# Patient Record
Sex: Female | Born: 2000
Health system: Southern US, Community
[De-identification: ages and names within clinical notes are randomized; demographics above are authoritative.]

## PROBLEM LIST (undated history)

## (undated) DIAGNOSIS — N926 Irregular menstruation, unspecified: Secondary | ICD-10-CM

## (undated) DIAGNOSIS — Z87442 Personal history of urinary calculi: Secondary | ICD-10-CM

## (undated) DIAGNOSIS — K7689 Other specified diseases of liver: Secondary | ICD-10-CM

## (undated) DIAGNOSIS — J3501 Chronic tonsillitis: Secondary | ICD-10-CM

## (undated) DIAGNOSIS — L709 Acne, unspecified: Secondary | ICD-10-CM

## (undated) DIAGNOSIS — K219 Gastro-esophageal reflux disease without esophagitis: Secondary | ICD-10-CM

## (undated) DIAGNOSIS — F419 Anxiety disorder, unspecified: Secondary | ICD-10-CM

## (undated) DIAGNOSIS — F32A Depression, unspecified: Secondary | ICD-10-CM

## (undated) DIAGNOSIS — F988 Other specified behavioral and emotional disorders with onset usually occurring in childhood and adolescence: Secondary | ICD-10-CM

## (undated) HISTORY — DX: Depression, unspecified: F32.A

## (undated) HISTORY — PX: TYMPANOSTOMY TUBE PLACEMENT: SHX32

## (undated) HISTORY — DX: Gastro-esophageal reflux disease without esophagitis: K21.9

## (undated) HISTORY — DX: Other specified diseases of liver: K76.89

## (undated) HISTORY — DX: Anxiety disorder, unspecified: F41.9

---
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
## 2021-01-05 ENCOUNTER — Other Ambulatory Visit (INDEPENDENT_AMBULATORY_CARE_PROVIDER_SITE_OTHER): Payer: Self-pay | Admitting: Pediatric Endocrinology

## 2021-01-05 ENCOUNTER — Encounter (INDEPENDENT_AMBULATORY_CARE_PROVIDER_SITE_OTHER): Payer: Self-pay

## 2021-01-05 DIAGNOSIS — K760 Fatty (change of) liver, not elsewhere classified: Secondary | ICD-10-CM | POA: Diagnosis not present

## 2021-01-05 DIAGNOSIS — Z68.41 Body mass index (BMI) pediatric, greater than or equal to 95th percentile for age: Secondary | ICD-10-CM

## 2021-01-05 NOTE — Telephone Encounter (Signed)
Called patient on 01/05/2021 at 3:57 PM and left HIPAA-compliant VM with instructions to call [facility] back.  Plan to discuss GLP-1 appeal denial.   Thank you for involving pharmacy/diabetes educator to assist in providing this patient's care.   Drexel Iha, PharmD, CPP, CDCES

## 2021-01-16 ENCOUNTER — Other Ambulatory Visit (HOSPITAL_COMMUNITY): Payer: Self-pay | Admitting: Family Medicine

## 2021-01-22 IMAGING — US US ABDOMEN COMPLETE
1 series · 13 of 25 positions shown · non-contrast
Comparison: 01/10/2020, 11/04/2015

CLINICAL DATA: Follow-up liver cysts

EXAM:
ABDOMEN ULTRASOUND COMPLETE

[Series 1: us abdomen complete · 0.23mm/px · 13 of 125 slices shown]
[im 1/125]
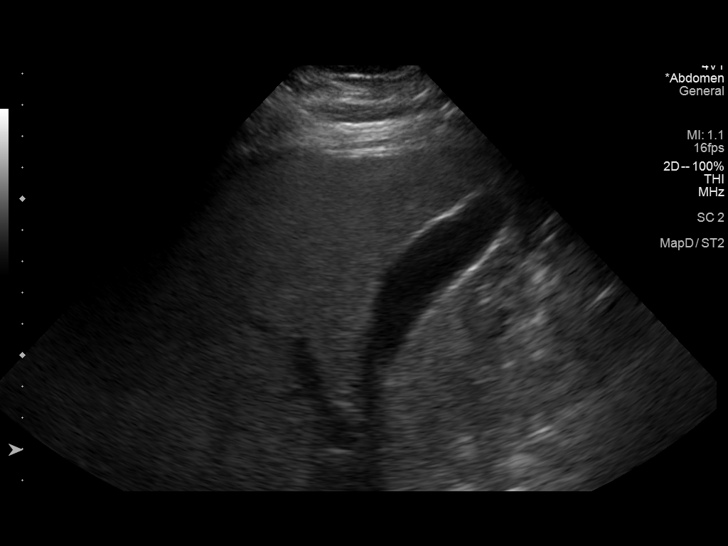
[im 11/125]
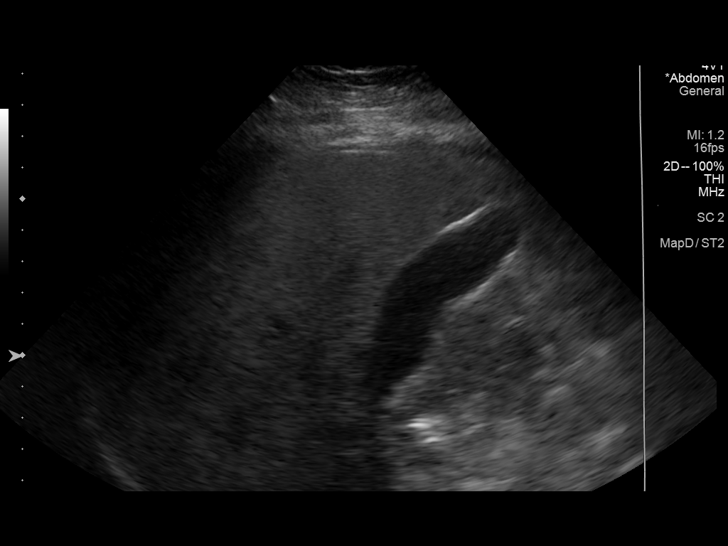
[im 21/125]
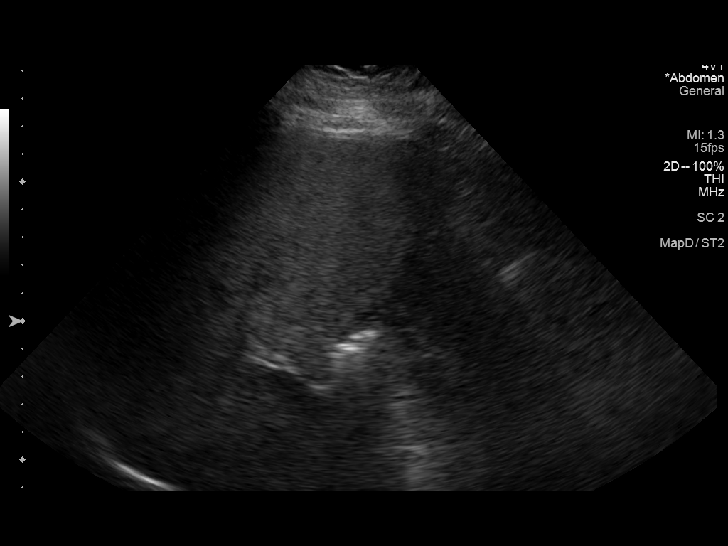
[im 32/125]
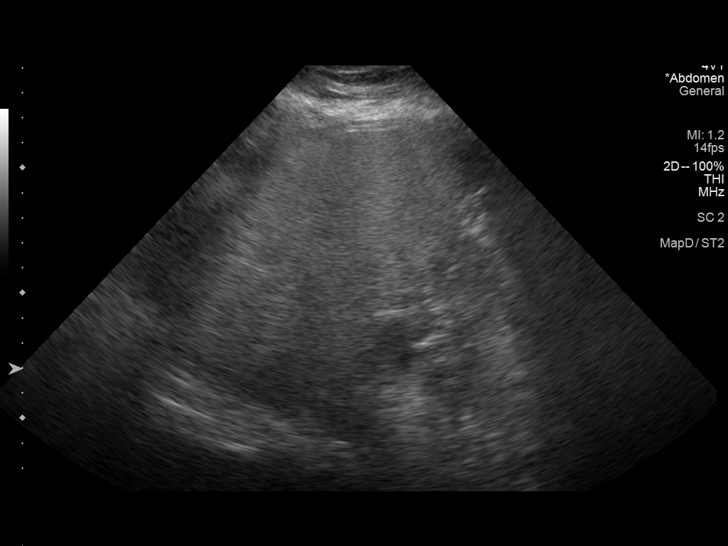
[im 42/125]
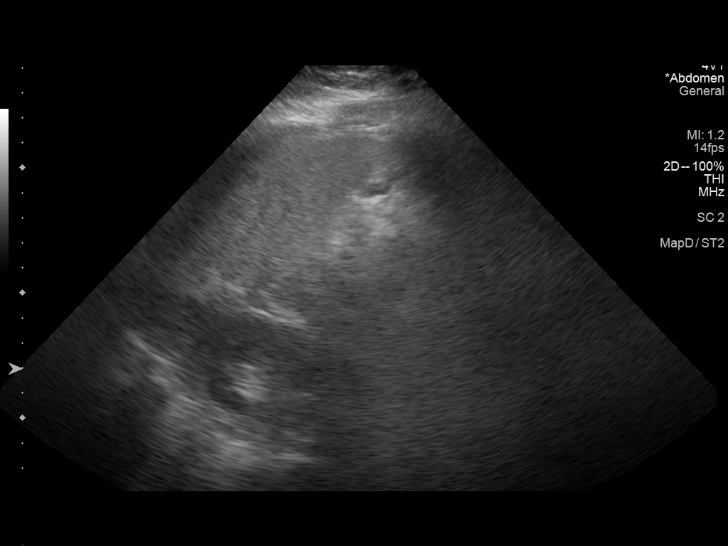
[im 52/125]
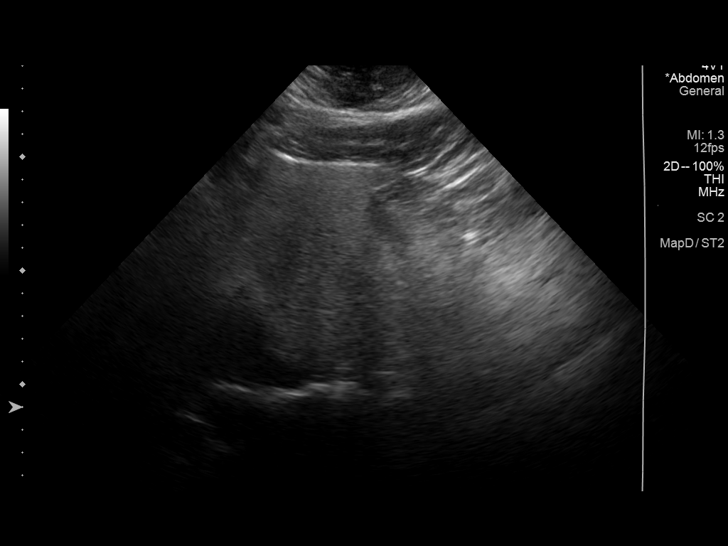
[im 63/125]
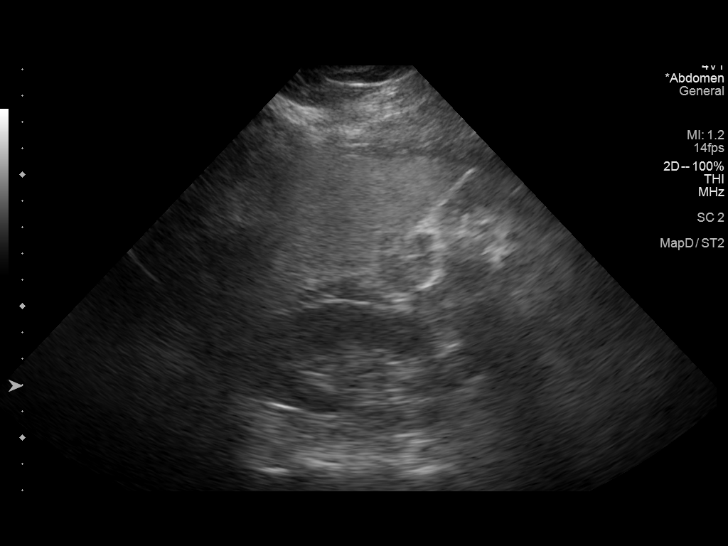
[im 73/125]
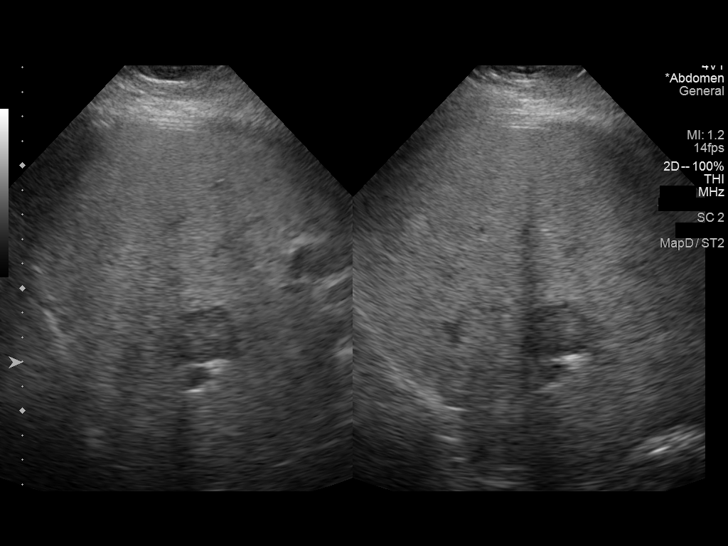
[im 83/125]
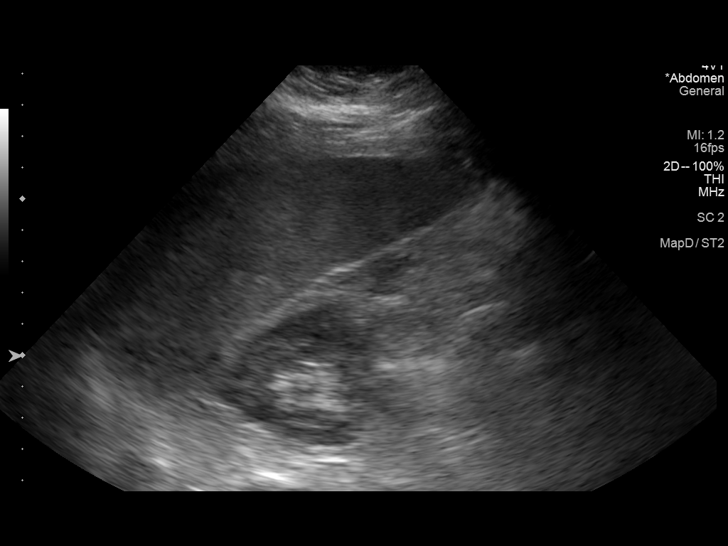
[im 94/125]
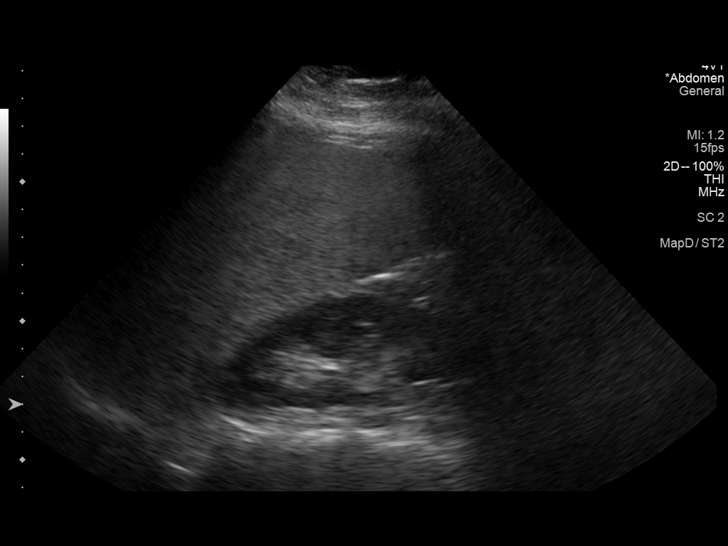
[im 104/125]
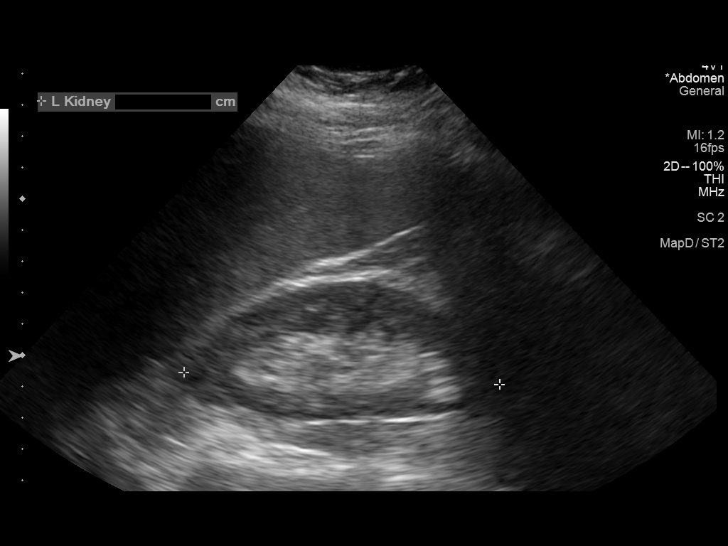
[im 114/125]
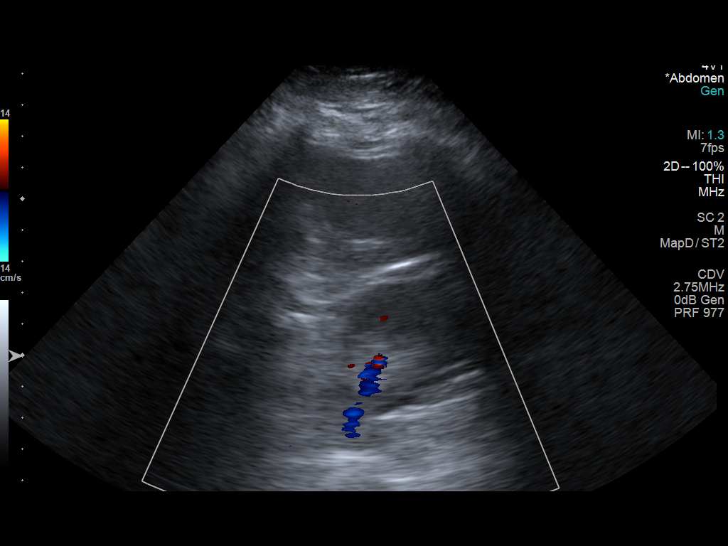
[im 125/125]
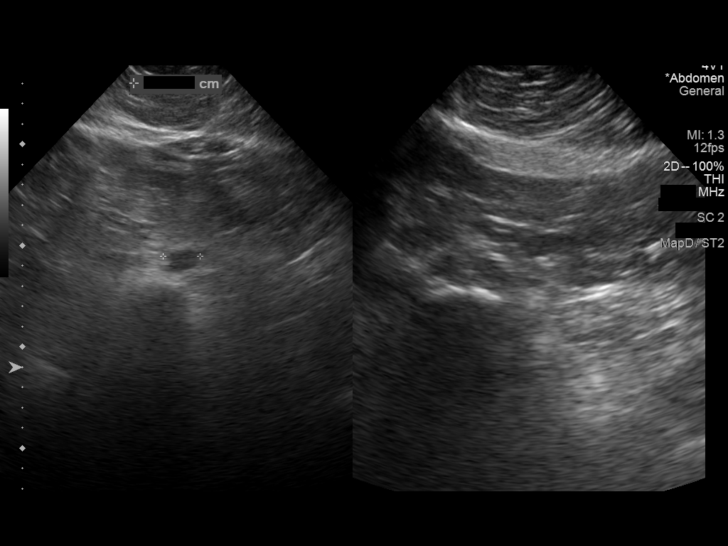

[13 of 25 positions shown; findings below may reference images not displayed]

FINDINGS: Gallbladder: No gallstones or wall thickening visualized. No
sonographic Murphy sign noted by sonographer.

Common bile duct: Diameter: 2.9 mm

Liver: Diffusely echogenic consistent with steatosis. Focal
hypoechoic lesion with internal echoes within the right hepatic lobe
measuring 2.7 x 2.4 by 2.9 cm, previously 2.2 x 2.4 x 3.1 cm,
appears more solid on today's study. Portal vein is patent on color
Doppler imaging with normal direction of blood flow towards the
liver.

IVC: No abnormality visualized.

Pancreas: Visualized portion unremarkable.

Spleen: Size and appearance within normal limits.

Right Kidney: Length: 10.2 cm. Echogenicity within normal limits. No
mass or hydronephrosis visualized.

Left Kidney: Length: 10.1 cm. Echogenicity within normal limits. No
mass or hydronephrosis visualized.

Abdominal aorta: No aneurysm visualized.

Other findings: None.
IMPRESSION: 1. Diffusely echogenic liver consistent with hepatic steatosis.
2. 2.9 cm mixed echogenicity lesion in the right hepatic lobe,
appears to contain increased internal echoes on today's study and is
potentially solid. MRI is recommended for further evaluation.

These results will be called to the ordering clinician or
representative by the Radiologist Assistant, and communication
documented in the PACS or [REDACTED].

## 2021-01-29 ENCOUNTER — Other Ambulatory Visit: Payer: Self-pay

## 2021-01-29 ENCOUNTER — Telehealth (INDEPENDENT_AMBULATORY_CARE_PROVIDER_SITE_OTHER): Payer: BC Managed Care – PPO | Admitting: Pediatric Endocrinology

## 2021-01-29 ENCOUNTER — Encounter (INDEPENDENT_AMBULATORY_CARE_PROVIDER_SITE_OTHER): Payer: Self-pay

## 2021-01-29 ENCOUNTER — Other Ambulatory Visit (INDEPENDENT_AMBULATORY_CARE_PROVIDER_SITE_OTHER): Payer: Self-pay | Admitting: Pediatric Endocrinology

## 2021-01-29 DIAGNOSIS — E6609 Other obesity due to excess calories: Secondary | ICD-10-CM

## 2021-01-29 DIAGNOSIS — E8881 Metabolic syndrome: Secondary | ICD-10-CM | POA: Diagnosis not present

## 2021-01-29 DIAGNOSIS — E282 Polycystic ovarian syndrome: Secondary | ICD-10-CM

## 2021-01-29 DIAGNOSIS — Z68.41 Body mass index (BMI) pediatric, greater than or equal to 95th percentile for age: Secondary | ICD-10-CM

## 2021-01-29 DIAGNOSIS — F418 Other specified anxiety disorders: Secondary | ICD-10-CM

## 2021-01-29 DIAGNOSIS — F411 Generalized anxiety disorder: Secondary | ICD-10-CM | POA: Insufficient documentation

## 2021-01-29 MED ORDER — ESCITALOPRAM OXALATE 5 MG PO TABS: 5.0000 mg | ORAL_TABLET | Freq: Every day | ORAL | 3 refills | Status: DC

## 2021-01-29 NOTE — Progress Notes (Signed)
This is a Pediatric Specialist E-Visit follow up consult provided via [place] consented to an E-Visit consult today.  Location of patient: Jacqueline Fuller is at home (location) Location of provider: Reine Just is at office (location) Patient was referred by Jacqueline Jeans, MD   The following participants were involved in this E-Visit: Quincy Carnes (CMA), Dr. Baldo Ash (list of participants and their roles)  This visit was done via [place] Complain/ Reason for E-Visit today: Weight gain Total time on call: 32 min Follow up: 3 months    Subjective:  Subjective  Patient Name: Jacqueline Fuller Date of Birth: September 16, 2001  MRN: [id number]  Jacqueline Fuller  Presents to clinic today for follow up evaluation and management of her "rapid weight gain" and PCOS symptoms.   HISTORY OF PRESENT ILLNESS:   Jacqueline Fuller is a 20 y.o. Caucasian female  Elmer was accompanied by her grandmother    1. Jacqueline Fuller was seen by her PCP in October 2017 for abdominal pain at age 56.  She was noted to have ongoing rapid weight gain. She was referred to GI who diagnosed her with lactose intolerance. She was also seen by nutrition who uncovered a PCOS type picture with irregular menses and recommended that she be seen by Endocrinology. She was then referred to endocrinology for further evaluation and management.   2. Jacqueline Fuller was last seen in [facility] on 09/30/20.  In the interim she has been doing ok.  Family has moved to a new complex. There is a 24 hour gym, a pool, and a walking trail. Jacqueline Fuller feels that she has been more active since they moved there. Unfortunately she has also had issues with the apartment being "stuffy" and she and her mom have not felt great.   She does think that she is in a better place with caring for her body. She is being more active.   She is drinking only water. She has found that she has an allergy now to potassium benzoate- which is in  many bottled drinks- so she really only drinks water.   She has not been to see an allergist.   Her RUQ pain has improved. Sometimes if she moves funny it will feel weird but not as painful as before. Her Hepatologist thinks that she should not feel anything from the lesion that she has.   She feels that her anxiety is better. She is able to go out more. She is able to walk and go into stores more often.   She has continued with her 50 squats and 50 arm curls every other day. She wears a knee brace. She had reduced to an 8 pound medicine ball because they left the 10 pound ball in their storage locker when they moved. She has recently gotten a new 10 pound medicine ball but has not started using it yet.   She is not on birth control. She feels that over the past few months her period is getting better. She says that when they were moving everything was very stressful. She was having a lot of swelling in her neck and face- even once with water- like she was allergic to many different things. She had an extended cycle with bleeding for about a month. However, in the past 2 months she has regular periods with no cramps.   She has been working on not doing boredom eating. She doesn't always feel hungry. She doesn't think that she emotionally eats. No  binge eating.   She is still working on getting her GED. It has been postponed due to medical issues. She was upset because the program director said that she didn't think Jacqueline Fuller "want(ed) it enough" and wanted Jacqueline Fuller to commit more time to the program even though Jacqueline Fuller was doing well academically and passing her practice exams.   3. Pertinent Review of Systems:  Constitutional: The patient feels "okay". The patient seems healthy and active.  Eyes: Vision seems to be good. There are no recognized eye problems. Neck: The patient has no complaints of anterior neck swelling, soreness, tenderness, pressure, discomfort, or difficulty swallowing.   Heart:  Heart rate increases with exercise or other physical activity. The patient has no complaints of palpitations, irregular heart beats, chest pain, or chest pressure.   Lungs: no asthma or wheezing. Did not get flu shot this year.  Gastrointestinal: per HPI Legs: Muscle mass and strength seem normal. There are no complaints of numbness, tingling, burning, or pain. No edema is noted.  Feet: There are no obvious foot problems. There are no complaints of numbness, tingling, burning, or pain. No edema is noted. Neurologic: There are no recognized problems with muscle movement and strength, sensation, or coordination. GYN/GU: Per HPI LMP ~ 2 weeks ago Skin: Acne   PAST MEDICAL, FAMILY, AND SOCIAL HISTORY  Past Medical History:  Diagnosis Date  . Acne   . Anxiety    Phreesia 01/29/2021  . Attention deficit disorder (ADD)   . Chronic tonsillitis 11/2016   snores during sleep, mother denies apnea  . Depression    Phreesia 01/29/2021  . GERD (gastroesophageal reflux disease)    Phreesia 01/29/2021  . History of kidney stones    1 occurrence  . Irregular periods   . Liver cyst     Family History  Problem Relation Age of Onset  . Diabetes Maternal Grandfather   . Hypertension Maternal Grandfather   . Heart disease Maternal Grandfather   . Hypertension Maternal Aunt   . Asthma Maternal Aunt   . Hypertension Maternal Uncle      Current Outpatient Medications:  .  escitalopram (LEXAPRO) 5 MG tablet, Take 1 tablet (5 mg total) by mouth daily., Disp: 30 tablet, Rfl: 3 .  esomeprazole (NEXIUM) 40 MG capsule, Take by mouth., Disp: , Rfl:  .  hydrOXYzine (ATARAX/VISTARIL) 10 MG tablet, Take 3 tablets (30 mg total) by mouth 3 (three) times daily as needed for anxiety., Disp: 30 tablet, Rfl: 0 .  cetirizine (ZYRTEC) 10 MG tablet, Take 10 mg by mouth 2 (two) times daily. (Patient not taking: Reported on 01/29/2021), Disp: , Rfl:  .  loperamide (IMODIUM A-D) 2 MG tablet, Take by mouth. (Patient not  taking: Reported on 01/29/2021), Disp: , Rfl:  .  metFORMIN (GLUCOPHAGE) 500 MG tablet, [place] 1/2 dose (250 mg) daily (Patient not taking: Reported on 01/29/2021), Disp: 30 tablet, Rfl: 11 .  predniSONE (DELTASONE) 20 MG tablet, Take 20 mg by mouth 2 (two) times daily. (Patient not taking: No sig reported), Disp: , Rfl:  .  Semaglutide,0.25 or 0.5MG /DOS, (OZEMPIC, 0.25 OR 0.5 MG/DOSE,) 2 MG/1.5ML SOPN, Start with 0.25 mg/week x 4 weeks. Increase to 0.5 mg as directed. (Patient not taking: Reported on 01/29/2021), Disp: 1.5 mL, Rfl: 3 .  sucralfate (CARAFATE) 1 g tablet, Take 1 g by mouth 3 (three) times daily. (Patient not taking: Reported on 01/29/2021), Disp: , Rfl:   Allergies as of 01/29/2021 - Review Complete 01/29/2021  Allergen Reaction Noted  .  Iodinated diagnostic agents Anaphylaxis, Rash, and Swelling 01/29/2021  . Sincalide Anaphylaxis and Rash 02/22/2020  . Lactose intolerance (gi) Other (See Comments) 12/14/2016  . Cefdinir Rash 02/22/2020  . Cephalosporins Rash 10/25/2014     reports that she is a non-smoker but has been exposed to tobacco smoke. She has never used smokeless tobacco. She reports that she does not drink alcohol and does not use drugs. Pediatric History  Patient Parents  . Tobin,Katherine (Mother)   Other Topics Concern  . Not on file  Social History Narrative   Recently moved into an apartment with mom. Brother is in school so he stays there.    She is not currently working on getting her GED, and is not working. She is working on bettering her self physically and mentally before restarting that process.     1. School and Family:  Lives with mom, brother, and grandmother.  Working on [employer] 2. Activities: not currently active 3. Primary Care Provider: Harrie Jeans, MD 4. Not currently seeing a counselor.  Not currently seeing Dr. Creig Hines.   ROS: There are no other significant problems involving Eulene's other body systems.    Objective:  Objective   Vital Signs:   LMP 01/15/2021   Blood pressure percentiles are not available for patients who are 18 years or older.  Ht Readings from Last 3 Encounters:  09/30/20 5' 4.13" (1.629 m) (49 %, Z= -0.03)*  01/08/20 5' 4.13" (1.629 m) (50 %, Z= -0.01)*  01/08/19 5' 4.49" (1.638 m) (57 %, Z= 0.17)*   * Growth percentiles are based on CDC (Girls, 2-20 Years) data.   Wt Readings from Last 3 Encounters:  09/30/20 (!) 235 lb 6.4 oz (106.8 kg) (>99 %, Z= 2.34)*  03/03/20 224 lb 13.9 oz (102 kg) (99 %, Z= 2.26)*  01/08/20 221 lb 6.4 oz (100.4 kg) (99 %, Z= 2.24)*   * Growth percentiles are based on CDC (Girls, 2-20 Years) data.   HC Readings from Last 3 Encounters:  No data found for [facility]   There is no height or weight on file to calculate BSA. No height on file for this encounter. No weight on file for this encounter.    PHYSICAL EXAM:   Virtual Visit Patient is non toxic appearing She is in no distress Good oral moisture No visible goiter Normal work of breathing Good movement of extremities Subdued affect       LAB DATA:   Results for orders placed or performed in visit on 09/30/20  POCT Glucose (Device for Home Use)  Result Value Ref Range   Glucose Fasting, POC     POC Glucose 85 70 - 99 mg/dl  POCT glycosylated hemoglobin (Hb A1C)  Result Value Ref Range   Hemoglobin A1C 4.5 4.0 - 5.6 %   HbA1c POC (<> result, manual entry)     HbA1c, POC (prediabetic range)     HbA1c, POC (controlled diabetic range)     Lab Results  Component Value Date   HGBA1C 4.5 09/30/2020   HGBA1C 5.0 12/27/2018   HGBA1C 5.0 06/27/2018   HGBA1C 4.7 10/31/2017   HGBA1C 5.8 06/28/2017   HGBA1C 145 12/16/2016   HGBA1C 4.5 11/11/2016        Assessment and Plan:  Assessment  ASSESSMENT: Aeriel is a 20 y.o. Caucasian female referred for rapid weight gain.   Obesity -  Weight not measured today - Drinking only water - More active - Denied by insurance for GLP-1  therapy  Depression/Anxiety - not currently seeing any providers or taking any medications.  - agreed to prescription for Lexapro.   Insulin resistance/PCOS/irregular menses - Was previously on Junel 1/20 and Spironolactone but did not like either of these - Was then on Kariva but stopped after <1 week due to nausea and vomiting.  - Also stopped taking Spironolactone - Says cycles are currently regular - Rx for GLP-1 therapy denied by insurance (currently thought to be preferable to OCP for management of PCOS)    PLAN:  1. Diagnostic: none today 2. Therapeutic: Lexapro 5 mg po daily 3. Patient education: Discussion as above.  4. Follow-up: Return in about 3 months (around 05/01/2021).     Lelon Huh, MD  Level of Service:  >40 minutes spent today reviewing the medical chart, counseling the patient/family, and documenting today's encounter. Today's encounter was a [place] visit.   PCP Jacqueline Jeans, MD

## 2021-02-05 MED FILL — SODIUM FLUORIDE 5000 PLUS 1: 1.1 | 30 days supply | Qty: 51 | Fill #0

## 2021-02-24 ENCOUNTER — Encounter (INDEPENDENT_AMBULATORY_CARE_PROVIDER_SITE_OTHER): Payer: Self-pay | Admitting: Dietician

## 2022-10-16 ENCOUNTER — Ambulatory Visit
Admission: RE | Admit: 2022-10-16 | Discharge: 2022-10-16 | Disposition: A | Discharge status: Home / Self Care | Payer: BC Managed Care – PPO | Source: Ambulatory Visit | Attending: Family Medicine | Admitting: Family Medicine

## 2022-10-16 ENCOUNTER — Other Ambulatory Visit: Payer: Self-pay

## 2022-10-16 VITALS — BP 130/83 | HR 85 | Temp 99.1°F | Resp 20

## 2022-10-16 DIAGNOSIS — J3089 Other allergic rhinitis: Secondary | ICD-10-CM

## 2022-10-16 DIAGNOSIS — J209 Acute bronchitis, unspecified: Secondary | ICD-10-CM

## 2022-10-16 MED ORDER — PROMETHAZINE-DM 6.25-15 MG/5ML PO SYRP: 5.0000 mL | ORAL_SOLUTION | Freq: Four times a day (QID) | ORAL | 0 refills | Status: DC | PRN

## 2022-10-16 MED ORDER — PREDNISONE 20 MG PO TABS: 40.0000 mg | ORAL_TABLET | Freq: Every day | ORAL | 0 refills | Status: DC

## 2022-10-16 MED ORDER — ALBUTEROL SULFATE HFA 108 (90 BASE) MCG/ACT IN AERS: 2.0000 | INHALATION_SPRAY | RESPIRATORY_TRACT | 0 refills | Status: DC | PRN

## 2022-10-16 NOTE — ED Provider Notes (Signed)
[facility]    CSN: [id number] Arrival date & time: 10/16/22  1158      History   Chief Complaint Chief Complaint  Patient presents with   Cough    Ongoing cough that has gotten worse. Diminished breath sounds on rt side- upper and lower lung. - Entered by patient    HPI Jacqueline Fuller is a 21 y.o. female.   Presenting today with 1 week history of worsening chronic cough now with wheezing, chest tightness at times.  Denies fever, chills, chest pain, shortness of breath, abdominal pain, nausea vomiting or diarrhea.  Does have a history of seasonal allergies so takes an antihistamine daily for this.  States that she has had a chronic cough since having COVID several years ago but this is worse than usual.  States a family member listen to her lungs yesterday and reported "diminished breath sounds on the right lower and upper fields ".  Not trying anything beyond her daily allergy medication for symptoms.    Past Medical History:  Diagnosis Date   Acne    Anxiety    Phreesia 01/29/2021   Attention deficit disorder (ADD)    Chronic tonsillitis 11/2016   snores during sleep, mother denies apnea   Depression    Phreesia 01/29/2021   GERD (gastroesophageal reflux disease)    Phreesia 01/29/2021   History of kidney stones    1 occurrence   Irregular periods    Liver cyst     Patient Active Problem List   Diagnosis Date Noted   Anxiety with depression 01/29/2021   PCOS (polycystic ovarian syndrome) 03/27/2019   Non-suicidal self harm as coping mechanism ([place]) 03/27/2019   Adjustment disorder with mixed anxiety and depressed mood 12/27/2018   Hot flashes 06/27/2018   Dysfunctional uterine bleeding 02/24/2017   Suicidal ideation 11/11/2016   Insulin resistance 11/11/2016   Secondary oligomenorrhea 11/11/2016   Obesity due to excess calories with serious comorbidity and body mass index (BMI) in 98th to 99th percentile for age in pediatric patient 11/11/2016    ADHD (attention deficit hyperactivity disorder), inattentive type 08/27/2011    Past Surgical History:  Procedure Laterality Date   TONSILLECTOMY AND ADENOIDECTOMY Bilateral 12/21/2016   Procedure: TONSILLECTOMY AND ADENOIDECTOMY;  Surgeon: Rozetta Nunnery, MD;  Location: [place] [facility];  Service: ENT;  Laterality: Bilateral;   TYMPANOSTOMY TUBE PLACEMENT Bilateral     OB History   No obstetric history on file.      Home Medications    Prior to Admission medications   Medication Sig Start Date End Date Taking? Authorizing Provider  albuterol (VENTOLIN HFA) 108 (90 Base) MCG/ACT inhaler Inhale 2 puffs into the lungs every 4 (four) hours as needed for wheezing or shortness of breath. 10/16/22  Yes Volney American, PA-C  predniSONE (DELTASONE) 20 MG tablet Take 2 tablets (40 mg total) by mouth daily with breakfast. 10/16/22  Yes Volney American, PA-C  promethazine-dextromethorphan (PROMETHAZINE-DM) 6.25-15 MG/5ML syrup Take 5 mLs by mouth 4 (four) times daily as needed. 10/16/22  Yes Volney American, PA-C  cetirizine (ZYRTEC) 10 MG tablet Take 10 mg by mouth 2 (two) times daily.    [provider]  escitalopram (LEXAPRO) 5 MG tablet TAKE 1 TABLET BY MOUTH DAILY. 01/29/21 01/29/22  Lelon Huh, MD  esomeprazole (NEXIUM) 40 MG capsule Take by mouth as needed.    [provider]  loperamide (IMODIUM A-D) 2 MG tablet Take by mouth as needed.  [provider]  metFORMIN (GLUCOPHAGE) 500 MG tablet TAKE 1/2 TABLET BY MOUTH DAILY Patient not taking: Reported on 01/29/2021 12/23/20 12/23/21  Lelon Huh, MD  predniSONE (DELTASONE) 20 MG tablet Take 20 mg by mouth 2 (two) times daily. Patient not taking: Reported on 09/30/2020    [provider]  Semaglutide,0.25 or 0.'5MG'$ /DOS, (OZEMPIC, 0.25 OR 0.5 MG/DOSE,) 2 MG/1.5ML SOPN Start with 0.25 mg/week x 4 weeks. Increase to 0.5 mg as directed. Patient not taking: Reported on  01/29/2021 10/22/20   Lelon Huh, MD  sucralfate (CARAFATE) 1 g tablet Take 1 g by mouth 3 (three) times daily. Patient not taking: Reported on 01/29/2021 12/23/20   [provider]  sucralfate (CARAFATE) 1 g tablet TAKE 1 TABLET BY MOUTH 3 TIMES DAILY 06/16/43 06/15/84  Natasha Bence, MD  norgestimate-ethinyl estradiol ([place] 28) 0.25-35 MG-MCG tablet Take 1 tablet by mouth daily. 04/16/19 11/20/19  Trude Mcburney, FNP  venlafaxine XR (EFFEXOR XR) 75 MG 24 hr capsule Take 1 capsule (75 mg total) by mouth daily with breakfast. 05/09/19 11/20/19  Trude Mcburney, FNP    Family History Family History  Problem Relation Age of Onset   Diabetes Maternal Grandfather    Hypertension Maternal Grandfather    Heart disease Maternal Grandfather    Hypertension Maternal Aunt    Asthma Maternal Aunt    Hypertension Maternal Uncle     Social History Social History   Tobacco Use   Smoking status: Passive Smoke Exposure - Never Smoker   Smokeless tobacco: Never   Tobacco comments:    mother vapes  Substance Use Topics   Alcohol use: No   Drug use: No     Allergies   Iodinated contrast media, Sincalide, Lactose intolerance (gi), Cefdinir, and Cephalosporins   Review of Systems Review of Systems Per HPI  Physical Exam Triage Vital Signs ED Triage Vitals  Enc Vitals Group     BP 10/16/22 1232 130/83     Pulse Rate 10/16/22 1232 85     Resp 10/16/22 1232 20     Temp 10/16/22 1232 99.1 F (37.3 C)     Temp Source 10/16/22 1232 Oral     SpO2 10/16/22 1232 98 %     Weight --      Height --      Head Circumference --      Peak Flow --      Pain Score 10/16/22 1227 0     Pain Loc --      Pain Edu? --      Excl. in [place]? --    No data found.  Updated Vital Signs BP 130/83 (BP Location: Right Arm)   Pulse 85   Temp 99.1 F (37.3 C) (Oral)   Resp 20   LMP 10/10/2022 (Approximate)   SpO2 98%   Visual Acuity Right Eye Distance:   Left Eye Distance:   Bilateral  Distance:    Right Eye Near:   Left Eye Near:    Bilateral Near:     Physical Exam Vitals and nursing note reviewed.  Constitutional:      Appearance: Normal appearance.  HENT:     Head: Atraumatic.     Right Ear: Tympanic membrane and external ear normal.     Left Ear: Tympanic membrane and external ear normal.     Nose: Nose normal.     Mouth/Throat:     Mouth: Mucous membranes are moist.     Pharynx: No oropharyngeal  exudate or posterior oropharyngeal erythema.  Eyes:     Extraocular Movements: Extraocular movements intact.     Conjunctiva/sclera: Conjunctivae normal.  Cardiovascular:     Rate and Rhythm: Normal rate and regular rhythm.     Heart sounds: Normal heart sounds.  Pulmonary:     Effort: Pulmonary effort is normal.     Breath sounds: Normal breath sounds. No wheezing or rales.  Musculoskeletal:        General: Normal range of motion.     Cervical back: Normal range of motion and neck supple.  Skin:    General: Skin is warm and dry.  Neurological:     Mental Status: She is alert and oriented to person, place, and time.  Psychiatric:        Mood and Affect: Mood normal.        Thought Content: Thought content normal.      UC Treatments / Results  Labs (all labs ordered are listed, but only abnormal results are displayed) Labs Reviewed - No data to display  EKG   Radiology No results found.  Procedures Procedures (including critical care time)  Medications Ordered in UC Medications - No data to display  Initial Impression / Assessment and Plan / UC Course  I have reviewed the triage vital signs and the nursing notes.  Pertinent labs & imaging results that were available during my care of the patient were reviewed by me and considered in my medical decision making (see chart for details).     Exam and vital signs benign and reassuring today, oxygen saturation is any 8% on room air and she is speaking in full sentences and breathing  comfortably on room air.  Lungs clear to auscultation bilaterally.  Suspect some residual bronchitis from ongoing seasonal allergies.  Treat with prednisone, albuterol as needed, Phenergan DM and good allergy regimen.  Return for worsening symptoms.  Final Clinical Impressions(s) / UC Diagnoses   Final diagnoses:  Acute bronchitis, unspecified organism  Seasonal allergic rhinitis due to other allergic trigger   Discharge Instructions   None    ED Prescriptions     Medication Sig Dispense Auth. Provider   predniSONE (DELTASONE) 20 MG tablet Take 2 tablets (40 mg total) by mouth daily with breakfast. 10 tablet Volney American, PA-C   promethazine-dextromethorphan (PROMETHAZINE-DM) 6.25-15 MG/5ML syrup Take 5 mLs by mouth 4 (four) times daily as needed. 100 mL Volney American, PA-C   albuterol (VENTOLIN HFA) 108 (90 Base) MCG/ACT inhaler Inhale 2 puffs into the lungs every 4 (four) hours as needed for wheezing or shortness of breath. 18 g Volney American, [place]      PDMP not reviewed this encounter.   Volney American, [place] 10/16/22 1252

## 2022-10-16 NOTE — ED Triage Notes (Signed)
Pt reports intermittent cough ever since Covid a few years ago and reports cough recently has changed with intermittent wheezing for last week. Pt reports family member listened to lungs and reports "diminished on right upper and lower." Denies shortness of breath, fevers.

## 2022-10-18 DIAGNOSIS — F4323 Adjustment disorder with mixed anxiety and depressed mood: Secondary | ICD-10-CM | POA: Diagnosis not present

## 2022-10-18 DIAGNOSIS — F418 Other specified anxiety disorders: Secondary | ICD-10-CM | POA: Diagnosis not present

## 2022-10-30 DIAGNOSIS — Z1152 Encounter for screening for COVID-19: Secondary | ICD-10-CM | POA: Diagnosis not present

## 2022-10-30 DIAGNOSIS — J069 Acute upper respiratory infection, unspecified: Secondary | ICD-10-CM | POA: Diagnosis not present

## 2022-10-30 DIAGNOSIS — R062 Wheezing: Secondary | ICD-10-CM | POA: Diagnosis not present

## 2022-11-02 DIAGNOSIS — J101 Influenza due to other identified influenza virus with other respiratory manifestations: Secondary | ICD-10-CM | POA: Diagnosis not present

## 2022-11-03 DIAGNOSIS — F418 Other specified anxiety disorders: Secondary | ICD-10-CM | POA: Diagnosis not present

## 2022-11-03 DIAGNOSIS — F4323 Adjustment disorder with mixed anxiety and depressed mood: Secondary | ICD-10-CM | POA: Diagnosis not present

## 2022-11-17 DIAGNOSIS — F418 Other specified anxiety disorders: Secondary | ICD-10-CM | POA: Diagnosis not present

## 2022-11-17 DIAGNOSIS — F4323 Adjustment disorder with mixed anxiety and depressed mood: Secondary | ICD-10-CM | POA: Diagnosis not present

## 2022-12-28 DIAGNOSIS — F418 Other specified anxiety disorders: Secondary | ICD-10-CM | POA: Diagnosis not present

## 2022-12-28 DIAGNOSIS — F4323 Adjustment disorder with mixed anxiety and depressed mood: Secondary | ICD-10-CM | POA: Diagnosis not present

## 2023-01-13 DIAGNOSIS — F418 Other specified anxiety disorders: Secondary | ICD-10-CM | POA: Diagnosis not present

## 2023-01-13 DIAGNOSIS — F4323 Adjustment disorder with mixed anxiety and depressed mood: Secondary | ICD-10-CM | POA: Diagnosis not present

## 2023-01-26 DIAGNOSIS — F4323 Adjustment disorder with mixed anxiety and depressed mood: Secondary | ICD-10-CM | POA: Diagnosis not present

## 2023-01-26 DIAGNOSIS — F418 Other specified anxiety disorders: Secondary | ICD-10-CM | POA: Diagnosis not present

## 2023-02-04 ENCOUNTER — Ambulatory Visit: Payer: BC Managed Care – PPO | Admitting: Family Medicine

## 2023-02-04 ENCOUNTER — Encounter: Payer: Self-pay | Admitting: Family Medicine

## 2023-02-04 ENCOUNTER — Other Ambulatory Visit (HOSPITAL_COMMUNITY): Payer: Self-pay

## 2023-02-04 VITALS — BP 136/84 | HR 97 | Ht 64.0 in | Wt 206.0 lb

## 2023-02-04 DIAGNOSIS — E282 Polycystic ovarian syndrome: Secondary | ICD-10-CM | POA: Diagnosis not present

## 2023-02-04 DIAGNOSIS — F418 Other specified anxiety disorders: Secondary | ICD-10-CM | POA: Diagnosis not present

## 2023-02-04 DIAGNOSIS — Z1159 Encounter for screening for other viral diseases: Secondary | ICD-10-CM

## 2023-02-04 DIAGNOSIS — E0789 Other specified disorders of thyroid: Secondary | ICD-10-CM | POA: Diagnosis not present

## 2023-02-04 DIAGNOSIS — E7849 Other hyperlipidemia: Secondary | ICD-10-CM

## 2023-02-04 DIAGNOSIS — R7301 Impaired fasting glucose: Secondary | ICD-10-CM

## 2023-02-04 DIAGNOSIS — E559 Vitamin D deficiency, unspecified: Secondary | ICD-10-CM

## 2023-02-04 DIAGNOSIS — Z114 Encounter for screening for human immunodeficiency virus [HIV]: Secondary | ICD-10-CM

## 2023-02-04 MED ORDER — FLUOXETINE HCL 10 MG PO TABS
10.0000 mg | ORAL_TABLET | Freq: Every day | ORAL | 3 refills | Status: DC
  Filled 2023-02-04: qty 90, 90d supply, fill #0

## 2023-02-04 NOTE — Progress Notes (Signed)
New Patient Office Visit  Subjective:  Patient ID: Jacqueline Fuller, female    DOB: [birth year]  Age: 22 y.o. MRN: [id number]  CC:  Chief Complaint  Patient presents with   Annual Exam    Cpe today, pt would like to discuss hormonal concerns. Would like to discuss anxiety concerns and feeling stressed.      HPI Jacqueline Fuller is a 22 y.o. female with past medical history of PCOS, anxiety and depression presents for establishing care. For the details of today's visit, please refer to the assessment and plan.     Past Medical History:  Diagnosis Date   Acne    Anxiety    Phreesia 01/29/2021   Attention deficit disorder (ADD)    Chronic tonsillitis 11/2016   snores during sleep, mother denies apnea   Depression    Phreesia 01/29/2021   GERD (gastroesophageal reflux disease)    Phreesia 01/29/2021   History of kidney stones    1 occurrence   Irregular periods    Liver cyst     Past Surgical History:  Procedure Laterality Date   TONSILLECTOMY AND ADENOIDECTOMY Bilateral 12/21/2016   Procedure: TONSILLECTOMY AND ADENOIDECTOMY;  Surgeon: Rozetta Nunnery, MD;  Location: [place];  Service: ENT;  Laterality: Bilateral;   TYMPANOSTOMY TUBE PLACEMENT Bilateral     Family History  Problem Relation Age of Onset   Diabetes Maternal Grandfather    Hypertension Maternal Grandfather    Heart disease Maternal Grandfather    Hypertension Maternal Aunt    Asthma Maternal Aunt    Hypertension Maternal Uncle     Social History   Socioeconomic History   Marital status: Single    Spouse name: Not on file   Number of children: Not on file   Years of education: Not on file   Highest education level: Not on file  Occupational History   Not on file  Tobacco Use   Smoking status: Never    Passive exposure: Yes   Smokeless tobacco: Never   Tobacco comments:    mother vapes  Substance and Sexual Activity   Alcohol use: No   Drug use: No   Sexual  activity: Not on file  Other Topics Concern   Not on file  Social History Narrative   Recently moved into an apartment with mom. Brother is in school so he stays there.    She is not currently working on getting her GED, and is not working. She is working on bettering her self physically and mentally before restarting that process.    Social Determinants of Health   Financial Resource Strain: Not on file  Food Insecurity: Not on file  Transportation Needs: Not on file  Physical Activity: Not on file  Stress: Not on file  Social Connections: Not on file  Intimate Partner Violence: Not on file    ROS Review of Systems  Constitutional:  Negative for chills and fever.  Eyes:  Negative for visual disturbance.  Respiratory:  Negative for chest tightness and shortness of breath.   Neurological:  Negative for dizziness and headaches.    Objective:   Today's Vitals: BP 136/84   Pulse 97   Ht 5\' 4"  (1.626 m)   Wt 206 lb 0.6 oz (93.5 kg)   SpO2 98%   BMI 35.37 kg/m   Physical Exam HENT:     Head: Normocephalic.     Mouth/Throat:     Mouth: Mucous membranes are moist.  Cardiovascular:     Rate and Rhythm: Normal rate.     Heart sounds: Normal heart sounds.  Pulmonary:     Effort: Pulmonary effort is normal.     Breath sounds: Normal breath sounds.  Neurological:     Mental Status: She is alert.      Assessment & Plan:   PCOS (polycystic ovarian syndrome) Assessment & Plan: Diagnosed at the age of 22 years old She was following up with pediatric endocrinology and has not followed up for several years She reports taking over-the-counter supplement called Cyster Glow for her PCOS and would like to assess her hormone levels today PCOS orders are placed No other concerns or complaints voiced  Orders: -     FSH/LH -     Testosterone,Free and Total -     Estradiol  Anxiety with depression Assessment & Plan: GAD-7 is 21 She complains of increased anxiety and  depression She used to take Lexapro 5 mg daily in 2022 but has not been on therapy since Reports occasionally having thoughts of self-harm but no thoughts of killing herself Encouraged the patient to report to the ED psychiatric unit when having thoughts of self-harm Patient verbalized understanding Will start the patient on Prozac 10 mg daily Encouraged not abruptly discontinuing therapy Will reevaluate her symptoms and assess her response to therapy in 6 weeks   Orders: -     FLUoxetine HCl; Take 1 tablet (10 mg total) by mouth daily.  Dispense: 90 tablet; Refill: 3  Other specified disorders of thyroid -     TSH + free T4  Other hyperlipidemia -     CBC with Differential/Platelet -     CMP14+EGFR -     Lipid panel  Impaired fasting blood sugar -     Hemoglobin A1c  Vitamin D deficiency -     VITAMIN D 25 Hydroxy (Vit-D Deficiency, Fractures)  Need for hepatitis C screening test -     Hepatitis C antibody  Encounter for screening for HIV -     HIV Antibody (routine testing w rflx)     Follow-up: Return in about 2 weeks (around 02/18/2023) for CPE.   Alvira Monday, FNP

## 2023-02-04 NOTE — Assessment & Plan Note (Addendum)
Diagnosed at the age of 22 years old She was following up with pediatric endocrinology and has not followed up for several years She reports taking over-the-counter supplement called Cyster Glow for her PCOS and would like to assess her hormone levels today PCOS orders are placed No other concerns or complaints voiced

## 2023-02-04 NOTE — Assessment & Plan Note (Signed)
GAD-7 is 21 She complains of increased anxiety and depression She used to take Lexapro 5 mg daily in 2022 but has not been on therapy since Reports occasionally having thoughts of self-harm but no thoughts of killing herself Encouraged the patient to report to the ED psychiatric unit when having thoughts of self-harm Patient verbalized understanding Will start the patient on Prozac 10 mg daily Encouraged not abruptly discontinuing therapy Will reevaluate her symptoms and assess her response to therapy in 6 weeks

## 2023-02-04 NOTE — Patient Instructions (Addendum)
I appreciate the opportunity to provide care to you today!    Follow up:  2 weeks for Physical Exam   Labs: please stop by the lab today to get your blood drawn (CBC, CMP, TSH, Lipid profile, HgA1c, Vit D)  Screening: HIV and Hep C   PCOS labs: FSH/LH, ESTRADIOL, Testosterone levles  Anxiety and depression Please start taking Prozac 10 mg daily Take at the same time each day, with or without food May take at least 2 weeks for benefit, and 6-8 weeks for full effect Do not abruptly discontinue to avoid withdrawal symptoms     Please continue to a heart-healthy diet and increase your physical activities. Try to exercise for 51mins at least five days a week.      It was a pleasure to see you and I look forward to continuing to work together on your health and well-being. Please do not hesitate to call the office if you need care or have questions about your care.   Have a wonderful day and week. With Gratitude, Alvira Monday MSN, FNP-BC

## 2023-02-16 DIAGNOSIS — R7301 Impaired fasting glucose: Secondary | ICD-10-CM | POA: Diagnosis not present

## 2023-02-16 DIAGNOSIS — Z1159 Encounter for screening for other viral diseases: Secondary | ICD-10-CM | POA: Diagnosis not present

## 2023-02-16 DIAGNOSIS — F4323 Adjustment disorder with mixed anxiety and depressed mood: Secondary | ICD-10-CM | POA: Diagnosis not present

## 2023-02-16 DIAGNOSIS — E7849 Other hyperlipidemia: Secondary | ICD-10-CM | POA: Diagnosis not present

## 2023-02-16 DIAGNOSIS — E559 Vitamin D deficiency, unspecified: Secondary | ICD-10-CM | POA: Diagnosis not present

## 2023-02-16 DIAGNOSIS — F418 Other specified anxiety disorders: Secondary | ICD-10-CM | POA: Diagnosis not present

## 2023-02-16 DIAGNOSIS — E0789 Other specified disorders of thyroid: Secondary | ICD-10-CM | POA: Diagnosis not present

## 2023-02-17 LAB — CBC WITH DIFFERENTIAL/PLATELET
Basophils Absolute: 0 10*3/uL (ref 0.0–0.2)
Basos: 0 %
EOS (ABSOLUTE): 0.1 10*3/uL (ref 0.0–0.4)
Eos: 1 %
Hematocrit: 42.8 % (ref 34.0–46.6)
Hemoglobin: 14.4 g/dL (ref 11.1–15.9)
Immature Grans (Abs): 0 10*3/uL (ref 0.0–0.1)
Immature Granulocytes: 0 %
Lymphocytes Absolute: 2 10*3/uL (ref 0.7–3.1)
Lymphs: 23 %
MCH: 31 pg (ref 26.6–33.0)
MCHC: 33.6 g/dL (ref 31.5–35.7)
MCV: 92 fL (ref 79–97)
Monocytes Absolute: 0.4 10*3/uL (ref 0.1–0.9)
Monocytes: 4 %
Neutrophils Absolute: 6.3 10*3/uL (ref 1.4–7.0)
Neutrophils: 72 %
Platelets: 326 10*3/uL (ref 150–450)
RBC: 4.65 x10E6/uL (ref 3.77–5.28)
RDW: 12.4 % (ref 11.7–15.4)
WBC: 8.8 10*3/uL (ref 3.4–10.8)

## 2023-02-17 LAB — CMP14+EGFR
ALT: 10 IU/L (ref 0–32)
AST: 14 IU/L (ref 0–40)
Albumin/Globulin Ratio: 2 (ref 1.2–2.2)
Albumin: 4.3 g/dL (ref 4.0–5.0)
Alkaline Phosphatase: 89 IU/L (ref 42–106)
BUN/Creatinine Ratio: 8 — ABNORMAL LOW (ref 9–23)
BUN: 5 mg/dL — ABNORMAL LOW (ref 6–20)
Bilirubin Total: 0.2 mg/dL (ref 0.0–1.2)
CO2: 20 mmol/L (ref 20–29)
Calcium: 9.4 mg/dL (ref 8.7–10.2)
Chloride: 104 mmol/L (ref 96–106)
Creatinine, Ser: 0.6 mg/dL (ref 0.57–1.00)
Globulin, Total: 2.2 g/dL (ref 1.5–4.5)
Glucose: 86 mg/dL (ref 70–99)
Potassium: 4.4 mmol/L (ref 3.5–5.2)
Sodium: 139 mmol/L (ref 134–144)
Total Protein: 6.5 g/dL (ref 6.0–8.5)
eGFR: 132 mL/min/{1.73_m2} (ref >59)

## 2023-02-17 LAB — HEMOGLOBIN A1C
Est. average glucose Bld gHb Est-mCnc: 97 mg/dL
Hgb A1c MFr Bld: 5 % (ref 4.8–5.6)

## 2023-02-17 LAB — LIPID PANEL
Chol/HDL Ratio: 4 ratio (ref 0.0–4.4)
Cholesterol, Total: 175 mg/dL (ref 100–199)
HDL: 44 mg/dL (ref >39)
LDL Chol Calc (NIH): 113 mg/dL — ABNORMAL HIGH (ref 0–99)
Triglycerides: 98 mg/dL (ref 0–149)
VLDL Cholesterol Cal: 18 mg/dL (ref 5–40)

## 2023-02-17 LAB — VITAMIN D 25 HYDROXY (VIT D DEFICIENCY, FRACTURES): Vit D, 25-Hydroxy: 47.9 ng/mL (ref 30.0–100.0)

## 2023-02-17 LAB — TSH+FREE T4
Free T4: 1.39 ng/dL (ref 0.82–1.77)
TSH: 2.48 u[IU]/mL (ref 0.450–4.500)

## 2023-02-17 LAB — ESTRADIOL: Estradiol: 172 pg/mL

## 2023-02-17 LAB — HEPATITIS C ANTIBODY: Hep C Virus Ab: NONREACTIVE

## 2023-02-19 NOTE — Progress Notes (Signed)
Your cholesterol levels are elevated. I want your LDL to be less than 100. I recommend avoiding simple carbohydrates including cakes, sweet desserts, ice cream, soda (diet or regular), sweet tea, candies, chips, cookies, store-bought juices, alcohol in excess of 1-2 drinks a day, lemonade, artificial sweeteners, donuts, coffee creamers, and sugar-free products.  I recommend avoiding greasy, fatty foods with increased physical activity.

## 2023-02-22 ENCOUNTER — Encounter: Payer: BC Managed Care – PPO | Admitting: Family Medicine

## 2023-02-23 ENCOUNTER — Encounter: Payer: Self-pay | Admitting: Family Medicine

## 2023-02-23 ENCOUNTER — Ambulatory Visit (INDEPENDENT_AMBULATORY_CARE_PROVIDER_SITE_OTHER): Payer: BC Managed Care – PPO | Admitting: Family Medicine

## 2023-02-23 VITALS — BP 137/81 | HR 97 | Ht 64.0 in | Wt 207.1 lb

## 2023-02-23 DIAGNOSIS — Z0001 Encounter for general adult medical examination with abnormal findings: Secondary | ICD-10-CM | POA: Diagnosis not present

## 2023-02-23 DIAGNOSIS — F321 Major depressive disorder, single episode, moderate: Secondary | ICD-10-CM

## 2023-02-23 DIAGNOSIS — F324 Major depressive disorder, single episode, in partial remission: Secondary | ICD-10-CM | POA: Insufficient documentation

## 2023-02-23 NOTE — Assessment & Plan Note (Signed)
Physical exam as documented Discussed heart-healthy diet  Encouraged to Exercise: If you are able: 30 -60 minutes a day ,4 days a week, or 150 minutes a week. The longer the better. Combine stretch, strength, and aerobic activities Encourage to eat whole Food, Plant Predominant Nutrition is highly recommended: Eat Plenty of vegetables, Mushrooms, fruits, Legumes, Whole Grains, Nuts, seeds in lieu of processed meats, processed snacks/pastries red meat, poultry, eggs.  Will f/u in 1 year for CPE    

## 2023-02-23 NOTE — Assessment & Plan Note (Addendum)
PHQ-9 is 16   Reports sucidial thought, noting that her last episode of suicidal thoughts was  2 to 3 days ago She is currently on Prozac 10 mg daily and speaks with a therapist at [facility] She requested to speak with a psychiatrist for the pharmacological management of her PTSD and other psychiatric concerns. Referral placed Encouraged the patient to self and made to behavioral health ED when having thoughts of suicidal ideation She does not have any written plans.

## 2023-02-23 NOTE — Patient Instructions (Signed)
I appreciate the opportunity to provide care to you today!    Follow up:  3 months  Labs: next visit   Referrals today- Psychiatry   Please continue to a heart-healthy diet and increase your physical activities. Try to exercise for at least five days a week.      It was a pleasure to see you and I look forward to continuing to work together on your health and well-being. Please do not hesitate to call the office if you need care or have questions about your care.   Have a wonderful day and week. With Gratitude, Gilmore Laroche MSN, FNP-BC

## 2023-02-23 NOTE — Progress Notes (Signed)
Complete physical exam  Patient: Jacqueline Fuller   DOB: 12/17/2000   21 y.o. Female  MRN: [id number]  Subjective:    Chief Complaint  Patient presents with   Annual Exam    Cpe today. Pt would like a referral to psychiatrist for medication refills. Pt will be bring immunization record next time for vaccine update in chart.     Jacqueline Fuller is a 22 y.o. female who presents today for a complete physical exam. She reports consuming a general diet.   She reports engaging low compact exercises daily for at least 10 to 15 minutes.  She generally feels well. She reports sleeping poorly. She does have additional problems to discuss today.    Most recent fall risk assessment:    02/23/2023    9:50 AM  Fall Risk   Falls in the past year? 0  Number falls in past yr: 0  Injury with Fall? 0  Risk for fall due to : No Fall Risks  Follow up Falls evaluation completed     Most recent depression screenings:    02/23/2023    9:50 AM 02/04/2023    1:46 PM  PHQ 2/9 Scores  PHQ - 2 Score 4 5  PHQ- 9 Score 16 18    Dental: No current dental problems and Last dental visit: February 15 2023  Patient Active Problem List   Diagnosis Date Noted   Depression, major, single episode, moderate 02/23/2023   Encounter for annual general medical examination with abnormal findings in adult 02/23/2023   Generalized anxiety disorder 01/29/2021   PCOS (polycystic ovarian syndrome) 03/27/2019   Non-suicidal self harm as coping mechanism 03/27/2019   Adjustment disorder with mixed anxiety and depressed mood 12/27/2018   Hot flashes 06/27/2018   Dysfunctional uterine bleeding 02/24/2017   Suicidal ideation 11/11/2016   Insulin resistance 11/11/2016   Secondary oligomenorrhea 11/11/2016   Obesity due to excess calories with serious comorbidity and body mass index (BMI) in 98th to 99th percentile for age in pediatric patient 11/11/2016   ADHD (attention deficit hyperactivity disorder), inattentive type  08/27/2011   Past Medical History:  Diagnosis Date   Acne    Anxiety    [employer] 01/29/2021   Attention deficit disorder (ADD)    Chronic tonsillitis 11/2016   snores during sleep, mother denies apnea   Depression    [employer] 01/29/2021   GERD (gastroesophageal reflux disease)    [employer] 01/29/2021   History of kidney stones    1 occurrence   Irregular periods    Liver cyst    Past Surgical History:  Procedure Laterality Date   TONSILLECTOMY AND ADENOIDECTOMY Bilateral 12/21/2016   Procedure: TONSILLECTOMY AND ADENOIDECTOMY;  Surgeon: Drema Halon, MD;  Location: [facility];  Service: ENT;  Laterality: Bilateral;   TYMPANOSTOMY TUBE PLACEMENT Bilateral    Social History   Tobacco Use   Smoking status: Never    Passive exposure: Yes   Smokeless tobacco: Never   Tobacco comments:    mother vapes  Substance Use Topics   Alcohol use: No   Drug use: No   Social History   Socioeconomic History   Marital status: Single    Spouse name: Not on file   Number of children: Not on file   Years of education: Not on file   Highest education level: Not on file  Occupational History   Not on file  Tobacco Use   Smoking status: Never  Passive exposure: Yes   Smokeless tobacco: Never   Tobacco comments:    mother vapes  Substance and Sexual Activity   Alcohol use: No   Drug use: No   Sexual activity: Not on file  Other Topics Concern   Not on file  Social History Narrative   Recently moved into an apartment with mom. Brother is in school so he stays there.    She is not currently working on getting her GED, and is not working. She is working on bettering her self physically and mentally before restarting that process.    Social Determinants of Health   Financial Resource Strain: Not on file  Food Insecurity: Not on file  Transportation Needs: Not on file  Physical Activity: Not on file  Stress: Not on file  Social Connections: Not on file   Intimate Partner Violence: Not on file   Family Status  Relation Name Status   Mother  Alive   Father  Alive   MGM  Alive   MGF  Alive   PGM  Alive   PGF  Alive   Mat Aunt  (Not Specified)   Mat Uncle  (Not Specified)   Family History  Problem Relation Age of Onset   Diabetes Maternal Grandfather    Hypertension Maternal Grandfather    Heart disease Maternal Grandfather    Hypertension Maternal Aunt    Asthma Maternal Aunt    Hypertension Maternal Uncle    Allergies  Allergen Reactions   Iodinated Contrast Media Anaphylaxis, Rash and Swelling   Sincalide Anaphylaxis and Rash    Pt w/ mild facial swelling and moderate facial rash w/ administration. Resolved w/ IV benedryl and IV Solumedrol. See note from April 2021.   Lactose Intolerance (Gi) Other (See Comments)    GI UPSET   Cefdinir Rash    Per patient's mother, patient had rash when she was "very young"   Cephalosporins Rash      Patient Care Team: Gilmore Laroche, FNP as PCP - General (Family Medicine)   Outpatient Medications Prior to Visit  Medication Sig   FLUoxetine (PROZAC) 10 MG tablet Take 1 tablet (10 mg total) by mouth daily.   albuterol (VENTOLIN HFA) 108 (90 Base) MCG/ACT inhaler Inhale 2 puffs into the lungs every 4 (four) hours as needed for wheezing or shortness of breath.   cetirizine (ZYRTEC) 10 MG tablet Take 10 mg by mouth 2 (two) times daily.   esomeprazole (NEXIUM) 40 MG capsule Take by mouth as needed.   loperamide (IMODIUM A-D) 2 MG tablet Take by mouth as needed.   metFORMIN (GLUCOPHAGE) 500 MG tablet TAKE 1/2 TABLET BY MOUTH DAILY (Patient not taking: Reported on 01/29/2021)   predniSONE (DELTASONE) 20 MG tablet Take 20 mg by mouth 2 (two) times daily. (Patient not taking: Reported on 09/30/2020)   predniSONE (DELTASONE) 20 MG tablet Take 2 tablets (40 mg total) by mouth daily with breakfast.   promethazine-dextromethorphan (PROMETHAZINE-DM) 6.25-15 MG/5ML syrup Take 5 mLs by mouth 4 (four)  times daily as needed.   Semaglutide,0.25 or 0.5MG /DOS, (OZEMPIC, 0.25 OR 0.5 MG/DOSE,) 2 MG/1.5ML SOPN Start with 0.25 mg/week x 4 weeks. Increase to 0.5 mg as directed. (Patient not taking: Reported on 01/29/2021)   sucralfate (CARAFATE) 1 g tablet Take 1 g by mouth 3 (three) times daily. (Patient not taking: Reported on 01/29/2021)   sucralfate (CARAFATE) 1 g tablet TAKE 1 TABLET BY MOUTH 3 TIMES DAILY   No facility-administered medications prior to visit.  ROS     Objective:    BP 137/81   Pulse 97   Ht 5\' 4"  (1.626 m)   Wt 207 lb 1.3 oz (93.9 kg)   SpO2 97%   BMI 35.55 kg/m  BP Readings from Last 3 Encounters:  02/23/23 137/81  02/04/23 136/84  10/16/22 130/83   Wt Readings from Last 3 Encounters:  02/23/23 207 lb 1.3 oz (93.9 kg)  02/04/23 206 lb 0.6 oz (93.5 kg)  09/30/20 (!) 235 lb 6.4 oz (106.8 kg) (>99 %, Z= 2.34)*   * Growth percentiles are based on CDC (Girls, 2-20 Years) data.      Physical Exam  No results found for any visits on 02/23/23. Last CBC Lab Results  Component Value Date   WBC 8.8 02/16/2023   HGB 14.4 02/16/2023   HCT 42.8 02/16/2023   MCV 92 02/16/2023   MCH 31.0 02/16/2023   RDW 12.4 02/16/2023   PLT 326 02/16/2023   Last metabolic panel Lab Results  Component Value Date   GLUCOSE 86 02/16/2023   NA 139 02/16/2023   K 4.4 02/16/2023   CL 104 02/16/2023   CO2 20 02/16/2023   BUN 5 (L) 02/16/2023   CREATININE 0.60 02/16/2023   EGFR 132 02/16/2023   CALCIUM 9.4 02/16/2023   PROT 6.5 02/16/2023   ALBUMIN 4.3 02/16/2023   LABGLOB 2.2 02/16/2023   AGRATIO 2.0 02/16/2023   BILITOT 0.2 02/16/2023   ALKPHOS 89 02/16/2023   AST 14 02/16/2023   ALT 10 02/16/2023   ANIONGAP 12 03/03/2020   Last lipids Lab Results  Component Value Date   CHOL 175 02/16/2023   HDL 44 02/16/2023   LDLCALC 113 (H) 02/16/2023   TRIG 98 02/16/2023   CHOLHDL 4.0 02/16/2023   Last hemoglobin A1c Lab Results  Component Value Date   HGBA1C 5.0  02/16/2023   Last thyroid functions Lab Results  Component Value Date   TSH 2.480 02/16/2023   Last vitamin D Lab Results  Component Value Date   VD25OH 47.9 02/16/2023   Last vitamin B12 and Folate No results found for: "VITAMINB12", "FOLATE"      Assessment & Plan:    Routine Health Maintenance and Physical Exam  Immunization History  Administered Date(s) Administered   Influenza-Unspecified 08/01/2013    Health Maintenance  Topic Date Due   COVID-19 Vaccine (1) Never done   HPV VACCINES (1 - 2-dose series) Never done   HIV Screening  Never done   DTaP/Tdap/Td (1 - Tdap) Never done   INFLUENZA VACCINE  06/16/2023   Hepatitis C Screening  Completed    Discussed health benefits of physical activity, and encouraged her to engage in regular exercise appropriate for her age and condition.  Encounter for annual general medical examination with abnormal findings in adult Assessment & Plan: Physical exam as documented Discussed heart-healthy diet  Encouraged to Exercise: If you are able: 30 -60 minutes a day ,4 days a week, or 150 minutes a week. The longer the better. Combine stretch, strength, and aerobic activities Encourage to eat whole Food, Plant Predominant Nutrition is highly recommended: Eat Plenty of vegetables, Mushrooms, fruits, Legumes, Whole Grains, Nuts, seeds in lieu of processed meats, processed snacks/pastries red meat, poultry, eggs.  Will f/u in 1 year for CPE      Depression, major, single episode, moderate Assessment & Plan: PHQ-9 is 16   Reports sucidial thought, noting that her last episode of suicidal thoughts was  2 to 3 days  ago She is currently on Prozac 10 mg daily and [facility] She requested to speak with a psychiatrist for the pharmacological management of her PTSD and other psychiatric concerns. Referral placed Encouraged the patient to self and made to behavioral health ED when having  thoughts of suicidal ideation She does not have any written plans.   Orders: -     Ambulatory referral to Pediatric Psychiatry    Return in about 1 year (around 02/23/2024) for CPE.     Gilmore LarocheGloria  Emilyann Banka, FNP

## 2023-03-02 DIAGNOSIS — F418 Other specified anxiety disorders: Secondary | ICD-10-CM | POA: Diagnosis not present

## 2023-03-02 DIAGNOSIS — F4323 Adjustment disorder with mixed anxiety and depressed mood: Secondary | ICD-10-CM | POA: Diagnosis not present

## 2023-04-28 ENCOUNTER — Ambulatory Visit (INDEPENDENT_AMBULATORY_CARE_PROVIDER_SITE_OTHER): Payer: BC Managed Care – PPO | Admitting: Psychiatry

## 2023-04-28 ENCOUNTER — Encounter (HOSPITAL_COMMUNITY): Payer: Self-pay | Admitting: Psychiatry

## 2023-04-28 ENCOUNTER — Other Ambulatory Visit (HOSPITAL_COMMUNITY): Payer: Self-pay

## 2023-04-28 DIAGNOSIS — R45851 Suicidal ideations: Secondary | ICD-10-CM | POA: Diagnosis not present

## 2023-04-28 DIAGNOSIS — F411 Generalized anxiety disorder: Secondary | ICD-10-CM

## 2023-04-28 DIAGNOSIS — R4588 Nonsuicidal self-harm: Secondary | ICD-10-CM | POA: Diagnosis not present

## 2023-04-28 DIAGNOSIS — F4010 Social phobia, unspecified: Secondary | ICD-10-CM | POA: Insufficient documentation

## 2023-04-28 DIAGNOSIS — F418 Other specified anxiety disorders: Secondary | ICD-10-CM

## 2023-04-28 DIAGNOSIS — F4310 Post-traumatic stress disorder, unspecified: Secondary | ICD-10-CM | POA: Diagnosis not present

## 2023-04-28 DIAGNOSIS — F3341 Major depressive disorder, recurrent, in partial remission: Secondary | ICD-10-CM

## 2023-04-28 MED ORDER — FLUOXETINE HCL 20 MG PO TABS
20.0000 mg | ORAL_TABLET | Freq: Every day | ORAL | 2 refills | Status: DC
  Filled 2023-04-28 – 2023-04-29 (×2): qty 30, 30d supply, fill #0
  Filled 2023-05-27: qty 30, 30d supply, fill #1

## 2023-04-28 NOTE — Patient Instructions (Addendum)
We increased the prozac (fluoxetine) to 20mg  once daily today. This should help with the anxiety and help reduce the suicidal thoughts that have been coming up. We also made a safety plan which should be available in your MyChart. If these thoughts should worsen, you can always let our office know but you can call these numbers 24hours per day 7 days per day: 911, 988, [facility]: [phone]. In an emergency you can also go to the ED. One thing you can add to your daily practice is working through this resource for the DBT (dialectical behavioral therapy) pdf: https://www.mosley.info/.pdf  As you are able, start to cut back on the amount of caffeine you are consuming per day by half a unit (can or cup) of caffeine every 5 days to avoid caffeine withdrawal headaches.
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
## 2023-04-29 ENCOUNTER — Other Ambulatory Visit: Payer: Self-pay

## 2023-04-29 ENCOUNTER — Other Ambulatory Visit (HOSPITAL_COMMUNITY): Payer: Self-pay

## 2023-05-20 ENCOUNTER — Encounter (INDEPENDENT_AMBULATORY_CARE_PROVIDER_SITE_OTHER): Payer: Self-pay

## 2023-05-25 ENCOUNTER — Encounter: Payer: Self-pay | Admitting: Family Medicine

## 2023-05-25 ENCOUNTER — Ambulatory Visit: Payer: BC Managed Care – PPO | Admitting: Family Medicine

## 2023-05-25 VITALS — BP 138/84 | HR 98 | Ht 64.0 in | Wt 195.1 lb

## 2023-05-25 DIAGNOSIS — K769 Liver disease, unspecified: Secondary | ICD-10-CM | POA: Insufficient documentation

## 2023-05-25 NOTE — Patient Instructions (Addendum)
I appreciate the opportunity to provide care to you today!    Follow up:  3 months CPE   Attached with your AVS, you will find valuable resources for self-education. I highly recommend dedicating some time to thoroughly examine them.   Please continue to a heart-healthy diet and increase your physical activities. Try to exercise for at least five days a week.    It was a pleasure to see you and I look forward to continuing to work together on your health and well-being. Please do not hesitate to call the office if you need care or have questions about your care.  In case of emergency, please visit the Emergency Department for urgent care, or contact our clinic at [phone] to schedule an appointment. We're here to help you!   Have a wonderful day and week. With Gratitude, Gilmore Laroche MSN, FNP-BC

## 2023-05-25 NOTE — Progress Notes (Signed)
Established Patient Office Visit  Subjective:  Patient ID: Jacqueline Fuller, female    DOB: 2001-04-08  Age: 22 y.o. MRN: [id number]  CC:  Chief Complaint  Patient presents with   Chronic Care Management    3 month f/u, pt would like to have an [place]  on her liver states she saw a liver specialist 2 years ago and needs yearly check ups on this.     HPI Jacqueline Fuller is a 22 y.o. female with past medical history of hepatic lesion presents for f/u of  chronic medical conditions. For the details of today's visit, please refer to the assessment and plan.     Past Medical History:  Diagnosis Date   Acne    Anxiety    Phreesia 01/29/2021   Attention deficit disorder (ADD)    Chronic tonsillitis 11/2016   snores during sleep, mother denies apnea   Depression    Phreesia 01/29/2021   GERD (gastroesophageal reflux disease)    Phreesia 01/29/2021   History of kidney stones    1 occurrence   Irregular periods    Liver cyst     Past Surgical History:  Procedure Laterality Date   TONSILLECTOMY AND ADENOIDECTOMY Bilateral 12/21/2016   Procedure: TONSILLECTOMY AND ADENOIDECTOMY;  Surgeon: Drema Halon, MD;  Location: [facility];  Service: ENT;  Laterality: Bilateral;   TYMPANOSTOMY TUBE PLACEMENT Bilateral     Family History  Problem Relation Age of Onset   Diabetes Maternal Grandfather    Hypertension Maternal Grandfather    Heart disease Maternal Grandfather    Hypertension Maternal Aunt    Asthma Maternal Aunt    Hypertension Maternal Uncle     Social History   Socioeconomic History   Marital status: Single    Spouse name: Not on file   Number of children: Not on file   Years of education: Not on file   Highest education level: 8th grade  Occupational History   Not on file  Tobacco Use   Smoking status: Never    Passive exposure: Yes   Smokeless tobacco: Never   Tobacco comments:    mother vapes  Substance and Sexual Activity   Alcohol  use: Never   Drug use: Never   Sexual activity: Not on file  Other Topics Concern   Not on file  Social History Narrative   Recently moved into an apartment with mom. Brother is in school so he stays there.    She is not currently working on getting her GED, and is not working. She is working on bettering her self physically and mentally before restarting that process.    Social Determinants of Health   Financial Resource Strain: Low Risk  (05/24/2023)   Overall Financial Resource Strain (CARDIA)    Difficulty of Paying Living Expenses: Not hard at all  Food Insecurity: No Food Insecurity (05/24/2023)   Hunger Vital Sign    Worried About Running Out of Food in the Last Year: Never true    Ran Out of Food in the Last Year: Never true  Transportation Needs: No Transportation Needs (05/24/2023)   PRAPARE - Administrator, Civil Service (Medical): No    Lack of Transportation (Non-Medical): No  Physical Activity: Insufficiently Active (05/24/2023)   Exercise Vital Sign    Days of Exercise per Week: 4 days    Minutes of Exercise per Session: 20 min  Stress: Stress Concern Present (05/24/2023)   [employer] of  Occupational Health - Occupational Stress Questionnaire    Feeling of Stress : Very much  Social Connections: Socially Isolated (05/24/2023)   Social Connection and Isolation Panel [NHANES]    Frequency of Communication with Friends and Family: Never    Frequency of Social Gatherings with Friends and Family: Never    Attends Religious Services: Never    Database administrator or Organizations: No    Attends Engineer, structural: Not on file    Marital Status: Never married  Intimate Partner Violence: Not on file    Outpatient Medications Prior to Visit  Medication Sig Dispense Refill   FLUoxetine (PROZAC) 20 MG tablet Take 1 tablet (20 mg total) by mouth daily. 30 tablet 2   Semaglutide (OZEMPIC, 0.25 OR 0.5 MG/DOSE, [place]) Inject into the skin.     albuterol  (VENTOLIN HFA) 108 (90 Base) MCG/ACT inhaler Inhale 2 puffs into the lungs every 4 (four) hours as needed for wheezing or shortness of breath. 18 g 0   cetirizine (ZYRTEC) 10 MG tablet Take 10 mg by mouth 2 (two) times daily.     esomeprazole (NEXIUM) 40 MG capsule Take by mouth as needed.     loperamide (IMODIUM A-D) 2 MG tablet Take by mouth as needed.     predniSONE (DELTASONE) 20 MG tablet Take 2 tablets (40 mg total) by mouth daily with breakfast. 10 tablet 0   promethazine-dextromethorphan (PROMETHAZINE-DM) 6.25-15 MG/5ML syrup Take 5 mLs by mouth 4 (four) times daily as needed. 100 mL 0   No facility-administered medications prior to visit.    Allergies  Allergen Reactions   Iodinated Contrast Media Anaphylaxis, Rash and Swelling   Sincalide Anaphylaxis and Rash    Pt w/ mild facial swelling and moderate facial rash w/ administration. Resolved w/ IV benedryl and IV Solumedrol. See note from April 2021.   Lactose Intolerance (Gi) Other (See Comments)    GI UPSET   Cefdinir Rash    Per patient's mother, patient had rash when she was "very young"   Cephalosporins Rash    ROS Review of Systems  Constitutional:  Negative for chills and fever.  Eyes:  Negative for visual disturbance.  Respiratory:  Negative for chest tightness and shortness of breath.   Neurological:  Negative for dizziness and headaches.      Objective:    Physical Exam HENT:     Head: Normocephalic.     Mouth/Throat:     Mouth: Mucous membranes are moist.  Cardiovascular:     Rate and Rhythm: Normal rate.     Heart sounds: Normal heart sounds.  Pulmonary:     Effort: Pulmonary effort is normal.     Breath sounds: Normal breath sounds.  Abdominal:     General: There is no distension.     Palpations: There is no mass.     Tenderness: There is no abdominal tenderness. There is no guarding or rebound.     Hernia: No hernia is present.  Neurological:     Mental Status: She is alert.     BP 138/84    Pulse 98   Ht 5\' 4"  (1.626 m)   Wt 195 lb 1.9 oz (88.5 kg)   SpO2 95%   BMI 33.49 kg/m  Wt Readings from Last 3 Encounters:  05/25/23 195 lb 1.9 oz (88.5 kg)  02/23/23 207 lb 1.3 oz (93.9 kg)  02/04/23 206 lb 0.6 oz (93.5 kg)    Lab Results  Component Value Date  TSH 2.480 02/16/2023   Lab Results  Component Value Date   WBC 8.8 02/16/2023   HGB 14.4 02/16/2023   HCT 42.8 02/16/2023   MCV 92 02/16/2023   PLT 326 02/16/2023   Lab Results  Component Value Date   NA 139 02/16/2023   K 4.4 02/16/2023   CO2 20 02/16/2023   GLUCOSE 86 02/16/2023   BUN 5 (L) 02/16/2023   CREATININE 0.60 02/16/2023   BILITOT 0.2 02/16/2023   ALKPHOS 89 02/16/2023   AST 14 02/16/2023   ALT 10 02/16/2023   PROT 6.5 02/16/2023   ALBUMIN 4.3 02/16/2023   CALCIUM 9.4 02/16/2023   ANIONGAP 12 03/03/2020   EGFR 132 02/16/2023   Lab Results  Component Value Date   CHOL 175 02/16/2023   Lab Results  Component Value Date   HDL 44 02/16/2023   Lab Results  Component Value Date   LDLCALC 113 (H) 02/16/2023   Lab Results  Component Value Date   TRIG 98 02/16/2023   Lab Results  Component Value Date   CHOLHDL 4.0 02/16/2023   Lab Results  Component Value Date   HGBA1C 5.0 02/16/2023      Assessment & Plan:  Hepatic lesion Assessment & Plan: Reviewed CT scan of the abdomen Last ultrasound of the abdomen was completed on 08/12/2020 for follow-up on hepatic lesion with recommendation for MRI however the patient declines MR screening and would like to proceed with an ultrasound to follow-up on her hepatic lesion.  No GI symptoms reported.  No abdominal discomfort reported.  No pain reported.  Orders: -     US ABDOMEN LIMITED RUQ (LIVER/GB)  Note: This chart has been completed using Engineer, civil (consulting) software, and while attempts have been made to ensure accuracy, certain words and phrases may not be transcribed as intended.    Follow-up: Return in about 3 months (around  08/25/2023).   Gilmore Laroche, FNP

## 2023-05-25 NOTE — Assessment & Plan Note (Addendum)
Reviewed CT scan of the abdomen Last ultrasound of the abdomen was completed on 08/12/2020 for follow-up on hepatic lesion with recommendation for MRI however the patient declines MR screening and would like to proceed with an ultrasound to follow-up on her hepatic lesion.  No GI symptoms reported.  No abdominal discomfort reported.  No pain reported.

## 2023-05-28 ENCOUNTER — Other Ambulatory Visit (HOSPITAL_COMMUNITY): Payer: Self-pay

## 2023-06-02 ENCOUNTER — Telehealth (HOSPITAL_COMMUNITY): Payer: BC Managed Care – PPO | Admitting: Psychiatry

## 2023-06-02 ENCOUNTER — Encounter (INDEPENDENT_AMBULATORY_CARE_PROVIDER_SITE_OTHER): Payer: Self-pay

## 2023-06-07 ENCOUNTER — Ambulatory Visit (HOSPITAL_COMMUNITY)
Admission: RE | Admit: 2023-06-07 | Discharge: 2023-06-07 | Disposition: A | Discharge status: Home / Self Care | Payer: BC Managed Care – PPO | Source: Ambulatory Visit | Attending: Family Medicine | Admitting: Family Medicine

## 2023-06-07 DIAGNOSIS — K769 Liver disease, unspecified: Secondary | ICD-10-CM | POA: Diagnosis not present

## 2023-06-08 NOTE — Progress Notes (Signed)
Please inform the patient that the previously noted liver lesion is no longer visible on the current exam, suggesting resolution of the lesion.

## 2023-07-01 ENCOUNTER — Ambulatory Visit
Admission: RE | Admit: 2023-07-01 | Discharge: 2023-07-01 | Disposition: A | Discharge status: Home / Self Care | Payer: BC Managed Care – PPO | Source: Ambulatory Visit | Attending: Nurse Practitioner

## 2023-07-01 VITALS — BP 120/83 | HR 120 | Temp 98.0°F | Resp 20

## 2023-07-01 DIAGNOSIS — H6091 Unspecified otitis externa, right ear: Secondary | ICD-10-CM | POA: Diagnosis not present

## 2023-07-01 MED ORDER — CIPROFLOXACIN-DEXAMETHASONE 0.3-0.1 % OT SUSP: 4.0000 [drp] | Freq: Two times a day (BID) | OTIC | 0 refills | Status: AC

## 2023-07-01 NOTE — ED Provider Notes (Signed)
[facility]    CSN: [id number] Arrival date & time: 07/01/23  1502      History   Chief Complaint Chief Complaint  Patient presents with   Ear Fullness    Right Ear pain, swelling, redness, fullness; pain radiating to jaw/throat/neck. Taking 600 mg ibuprofen- does not help much with the pain. - Entered by patient    HPI [name] is a 22 y.o. female.   The history is provided by the patient.   The patient presents for complaints of right ear pain is been present for the past 4 days.  Patient states that the pain extends behind the ear and into the right side of her face.  She denies fever, chills, ear drainage, decreased hearing, nasal congestion, runny nose, cough, headache, rash, or dizziness.  Patient reports she has been taking over-the-counter medications for her symptoms with some relief.  She denies history of recurrent ear infections.  Past Medical History:  Diagnosis Date   Acne    Anxiety    Phreesia 01/29/2021   Attention deficit disorder (ADD)    Chronic tonsillitis 11/2016   snores during sleep, mother denies apnea   Depression    Phreesia 01/29/2021   GERD (gastroesophageal reflux disease)    Phreesia 01/29/2021   History of kidney stones    1 occurrence   Irregular periods    Liver cyst     Patient Active Problem List   Diagnosis Date Noted   Hepatic lesion 05/25/2023   PTSD (post-traumatic stress disorder) 04/28/2023   Social anxiety disorder 04/28/2023   Major depressive disorder in partial remission (HCC) 02/23/2023   Encounter for annual general medical examination with abnormal findings in adult 02/23/2023   Generalized anxiety disorder 01/29/2021   PCOS (polycystic ovarian syndrome) 03/27/2019   Non-suicidal self harm as coping mechanism (HCC) 03/27/2019   Adjustment disorder with mixed anxiety and depressed mood 12/27/2018   Hot flashes 06/27/2018   Dysfunctional uterine bleeding 02/24/2017   Chronic suicidal ideation  rule out borderline personality disorder 11/11/2016   Insulin resistance 11/11/2016   Secondary oligomenorrhea 11/11/2016   Obesity due to excess calories with serious comorbidity and body mass index (BMI) in 98th to 99th percentile for age in pediatric patient 11/11/2016   ADHD (attention deficit hyperactivity disorder), inattentive type 08/27/2011    Past Surgical History:  Procedure Laterality Date   TONSILLECTOMY AND ADENOIDECTOMY Bilateral 12/21/2016   Procedure: TONSILLECTOMY AND ADENOIDECTOMY;  Surgeon: Drema Halon, MD;  Location: [facility];  Service: ENT;  Laterality: Bilateral;   TYMPANOSTOMY TUBE PLACEMENT Bilateral     OB History   No obstetric history on file.      Home Medications    Prior to Admission medications   Medication Sig Start Date End Date Taking? Authorizing Provider  ciprofloxacin-dexamethasone (CIPRODEX) OTIC suspension Place 4 drops into the right ear 2 (two) times daily for 7 days. 07/01/23 07/08/23 Yes Jarmal Lewelling-Warren, Sadie Haber, NP  albuterol (VENTOLIN HFA) 108 (90 Base) MCG/ACT inhaler Inhale 2 puffs into the lungs every 4 (four) hours as needed for wheezing or shortness of breath. 10/16/22   Particia Nearing, PA-C  cetirizine (ZYRTEC) 10 MG tablet Take 10 mg by mouth 2 (two) times daily.    [provider]  esomeprazole (NEXIUM) 40 MG capsule Take by mouth as needed.    [provider]  FLUoxetine (PROZAC) 20 MG tablet Take 1 tablet (20 mg total) by mouth daily. 04/28/23   Stinson,  Paul Dykes, MD  loperamide (IMODIUM A-D) 2 MG tablet Take by mouth as needed.    [provider]  predniSONE (DELTASONE) 20 MG tablet Take 2 tablets (40 mg total) by mouth daily with breakfast. 10/16/22   Particia Nearing, PA-C  promethazine-dextromethorphan (PROMETHAZINE-DM) 6.25-15 MG/5ML syrup Take 5 mLs by mouth 4 (four) times daily as needed. 10/16/22   Particia Nearing, PA-C  Semaglutide (OZEMPIC, 0.25 OR 0.5  MG/DOSE, [place]) Inject into the skin.    [provider]  norgestimate-ethinyl estradiol (SPRINTEC 28) 0.25-35 MG-MCG tablet Take 1 tablet by mouth daily. 04/16/19 11/20/19  Verneda Skill, FNP  venlafaxine XR (EFFEXOR XR) 75 MG 24 hr capsule Take 1 capsule (75 mg total) by mouth daily with breakfast. 05/09/19 11/20/19  Verneda Skill, FNP    Family History Family History  Problem Relation Age of Onset   Diabetes Maternal Grandfather    Hypertension Maternal Grandfather    Heart disease Maternal Grandfather    Hypertension Maternal Aunt    Asthma Maternal Aunt    Hypertension Maternal Uncle     Social History Social History   Tobacco Use   Smoking status: Never    Passive exposure: Yes   Smokeless tobacco: Never   Tobacco comments:    mother vapes  Substance Use Topics   Alcohol use: Never   Drug use: Never     Allergies   Iodinated contrast media, Sincalide, Lactose intolerance (gi), Cefdinir, and Cephalosporins   Review of Systems Review of Systems   Physical Exam Triage Vital Signs ED Triage Vitals  Encounter Vitals Group     BP 07/01/23 1513 120/83     Systolic BP Percentile --      Diastolic BP Percentile --      Pulse Rate 07/01/23 1513 (!) 120     Resp 07/01/23 1513 20     Temp 07/01/23 1513 98 F (36.7 C)     Temp Source 07/01/23 1513 Oral     SpO2 07/01/23 1513 97 %     Weight --      Height --      Head Circumference --      Peak Flow --      Pain Score 07/01/23 1514 6     Pain Loc --      Pain Education --      Exclude from Growth Chart --    No data found.  Updated Vital Signs BP 120/83 (BP Location: Right Arm)   Pulse (!) 120 Comment: pt states she is anxious  Temp 98 F (36.7 C) (Oral)   Resp 20   LMP 06/16/2023 (Approximate)   SpO2 97%   Visual Acuity Right Eye Distance:   Left Eye Distance:   Bilateral Distance:    Right Eye Near:   Left Eye Near:    Bilateral Near:     Physical Exam Vitals and nursing note  reviewed.  Constitutional:      General: She is not in acute distress.    Appearance: Normal appearance.  HENT:     Head: Normocephalic.     Right Ear: Hearing, tympanic membrane and external ear normal. Swelling (Right ear canal) and tenderness present.     Left Ear: Tympanic membrane, ear canal and external ear normal.     Ears:     Comments: Tenderness to the tragus and pinna of the right ear.  Right ear canal is swollen and edematous.    Nose: Nose normal.  Mouth/Throat:     Mouth: Mucous membranes are moist.  Eyes:     Extraocular Movements: Extraocular movements intact.     Conjunctiva/sclera: Conjunctivae normal.     Pupils: Pupils are equal, round, and reactive to light.  Cardiovascular:     Rate and Rhythm: Regular rhythm. Tachycardia present.     Pulses: Normal pulses.     Heart sounds: Normal heart sounds.  Pulmonary:     Effort: Pulmonary effort is normal.     Breath sounds: Normal breath sounds.  Abdominal:     General: Bowel sounds are normal.     Palpations: Abdomen is soft.     Tenderness: There is no abdominal tenderness.  Musculoskeletal:     Cervical back: Normal range of motion.  Skin:    General: Skin is warm and dry.  Neurological:     General: No focal deficit present.     Mental Status: She is alert and oriented to person, place, and time.  Psychiatric:        Mood and Affect: Mood normal.        Behavior: Behavior normal.      UC Treatments / Results  Labs (all labs ordered are listed, but only abnormal results are displayed) Labs Reviewed - No data to display  EKG   Radiology No results found.  Procedures Procedures (including critical care time)  Medications Ordered in UC Medications - No data to display  Initial Impression / Assessment and Plan / UC Course  I have reviewed the triage vital signs and the nursing notes.  Pertinent labs & imaging results that were available during my care of the patient were reviewed by me and  considered in my medical decision making (see chart for details).  The patient is well-appearing, she is in no acute distress, vital signs are stable.  Symptoms appear to be consistent with right otitis externa.  Will treat with Ciprodex otic suspension.  Supportive care recommendations were provided and discussed with the patient to include over-the-counter analgesics for pain or discomfort, warm compresses to the affected ear, and avoidance of injury of water inside of the ear.  Patient was advised if symptoms fail to improve with this treatment, recommend following up with her primary care physician or in this office for further evaluation.  Patient is in agreement with this plan of care and verbalizes understanding.  All questions were answered.  Patient stable for discharge.  Final Clinical Impressions(s) / UC Diagnoses   Final diagnoses:  Otitis externa of right ear, unspecified chronicity, unspecified type     Discharge Instructions      Take medication as prescribed. May take Tylenol or ibuprofen for pain, fever, or general discomfort. Warm compresses to the affected ear help with comfort. Do not stick anything inside the ear while symptoms persist. Avoid getting water inside of the ear while symptoms persist. If symptoms fail to improve, please follow-up with your primary care physician or in this clinic for further evaluation. Follow-up as needed.     ED Prescriptions     Medication Sig Dispense Auth. Provider   ciprofloxacin-dexamethasone (CIPRODEX) OTIC suspension Place 4 drops into the right ear 2 (two) times daily for 7 days. 7.5 mL Kiamesha Samet-Warren, Sadie Haber, NP      PDMP not reviewed this encounter.   Abran Cantor, NP 07/01/23 1549

## 2023-07-01 NOTE — ED Triage Notes (Signed)
Pt reports her right ear is painful, red, and facial swollen x 4 days    Took flexeril, prednisone, tylenol

## 2023-07-01 NOTE — Discharge Instructions (Signed)
Take medication as prescribed. May take Tylenol or ibuprofen for pain, fever, or general discomfort. Warm compresses to the affected ear help with comfort. Do not stick anything inside the ear while symptoms persist. Avoid getting water inside of the ear while symptoms persist. If symptoms fail to improve, please follow-up with your primary care physician or in this clinic for further evaluation. Follow-up as needed.

## 2023-07-01 NOTE — ED Notes (Addendum)
Pt refused throat swab. States "I dont think I can do a swab" Pt began holding her mouth.

## 2023-07-04 ENCOUNTER — Ambulatory Visit: Payer: BC Managed Care – PPO | Admitting: Internal Medicine

## 2023-07-05 ENCOUNTER — Encounter (HOSPITAL_COMMUNITY): Payer: Self-pay | Admitting: Psychiatry

## 2023-07-05 ENCOUNTER — Other Ambulatory Visit (HOSPITAL_COMMUNITY): Payer: Self-pay

## 2023-07-05 ENCOUNTER — Telehealth (INDEPENDENT_AMBULATORY_CARE_PROVIDER_SITE_OTHER): Payer: BC Managed Care – PPO | Admitting: Psychiatry

## 2023-07-05 DIAGNOSIS — F1590 Other stimulant use, unspecified, uncomplicated: Secondary | ICD-10-CM

## 2023-07-05 DIAGNOSIS — F3341 Major depressive disorder, recurrent, in partial remission: Secondary | ICD-10-CM | POA: Diagnosis not present

## 2023-07-05 DIAGNOSIS — F4010 Social phobia, unspecified: Secondary | ICD-10-CM

## 2023-07-05 DIAGNOSIS — F603 Borderline personality disorder: Secondary | ICD-10-CM | POA: Diagnosis not present

## 2023-07-05 DIAGNOSIS — F4310 Post-traumatic stress disorder, unspecified: Secondary | ICD-10-CM

## 2023-07-05 DIAGNOSIS — R45851 Suicidal ideations: Secondary | ICD-10-CM | POA: Diagnosis not present

## 2023-07-05 DIAGNOSIS — F411 Generalized anxiety disorder: Secondary | ICD-10-CM

## 2023-07-05 DIAGNOSIS — F410 Panic disorder [episodic paroxysmal anxiety] without agoraphobia: Secondary | ICD-10-CM

## 2023-07-05 MED ORDER — FLUOXETINE HCL 40 MG PO CAPS
40.0000 mg | ORAL_CAPSULE | Freq: Every day | ORAL | 2 refills | Status: DC
  Filled 2023-07-05: qty 30, 30d supply, fill #0
  Filled 2023-08-07: qty 30, 30d supply, fill #1
  Filled 2023-09-16: qty 30, 30d supply, fill #2

## 2023-07-05 NOTE — Patient Instructions (Addendum)
We increased the fluoxetine (prozac) to 40mg  once daily today. This should continue to help reduce your anxiety. Try to have a conversation with your mom around removing the excess needles from the home and storing unused medication securely to help reduce risk of harm. For psychotherapy, reach out to your insurer to find a DBT (dialectical behavioral therapy) provider that is in network and in the meantime he can start working through the DBT workbook found here: [email or web address]://www.mosley.info/.pdf  You can find vocational rehab services here: [email or web address]
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
## 2023-08-07 ENCOUNTER — Other Ambulatory Visit (HOSPITAL_COMMUNITY): Payer: Self-pay

## 2023-09-16 ENCOUNTER — Other Ambulatory Visit: Payer: Self-pay

## 2023-09-27 ENCOUNTER — Telehealth (INDEPENDENT_AMBULATORY_CARE_PROVIDER_SITE_OTHER): Payer: BC Managed Care – PPO | Admitting: Psychiatry

## 2023-09-27 ENCOUNTER — Encounter (HOSPITAL_COMMUNITY): Payer: Self-pay | Admitting: Psychiatry

## 2023-09-27 ENCOUNTER — Other Ambulatory Visit (HOSPITAL_COMMUNITY): Payer: Self-pay

## 2023-09-27 DIAGNOSIS — F603 Borderline personality disorder: Secondary | ICD-10-CM

## 2023-09-27 DIAGNOSIS — F411 Generalized anxiety disorder: Secondary | ICD-10-CM

## 2023-09-27 DIAGNOSIS — F4310 Post-traumatic stress disorder, unspecified: Secondary | ICD-10-CM | POA: Diagnosis not present

## 2023-09-27 DIAGNOSIS — F3341 Major depressive disorder, recurrent, in partial remission: Secondary | ICD-10-CM | POA: Diagnosis not present

## 2023-09-27 DIAGNOSIS — F4010 Social phobia, unspecified: Secondary | ICD-10-CM

## 2023-09-27 MED ORDER — FLUOXETINE HCL 40 MG PO CAPS
40.0000 mg | ORAL_CAPSULE | Freq: Every day | ORAL | 2 refills | Status: DC
  Filled 2023-09-27 – 2023-10-19 (×2): qty 30, 30d supply, fill #0

## 2023-09-27 NOTE — Patient Instructions (Signed)
We did not make any medication changes today.  Keep up the good work with cutting back on caffeine.  70 my chart message in 1 week letting me know the conversation goes with your parents about trying to get established with a psychotherapist.  Please follow up with your PCP for nutrition referral and the vitamin studies we talked about.
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
## 2023-09-28 ENCOUNTER — Other Ambulatory Visit (HOSPITAL_COMMUNITY): Payer: Self-pay

## 2023-10-03 ENCOUNTER — Encounter (HOSPITAL_COMMUNITY): Payer: Self-pay

## 2023-10-20 ENCOUNTER — Other Ambulatory Visit: Payer: Self-pay

## 2023-11-10 ENCOUNTER — Other Ambulatory Visit: Payer: Self-pay

## 2023-11-10 ENCOUNTER — Other Ambulatory Visit (HOSPITAL_COMMUNITY): Payer: Self-pay

## 2023-11-10 MED ORDER — DOXYCYCLINE MONOHYDRATE 25 MG/5ML PO SUSR
100.0000 mg | Freq: Every day | ORAL | 1 refills | Status: DC
  Filled 2023-11-10 – 2023-11-11 (×2): qty 600, 30d supply, fill #0

## 2023-11-11 ENCOUNTER — Other Ambulatory Visit (HOSPITAL_COMMUNITY): Payer: Self-pay

## 2023-11-11 ENCOUNTER — Other Ambulatory Visit: Payer: Self-pay

## 2023-11-14 ENCOUNTER — Other Ambulatory Visit: Payer: Self-pay

## 2023-11-14 ENCOUNTER — Other Ambulatory Visit (HOSPITAL_BASED_OUTPATIENT_CLINIC_OR_DEPARTMENT_OTHER): Payer: Self-pay

## 2023-11-14 ENCOUNTER — Other Ambulatory Visit (HOSPITAL_COMMUNITY): Payer: Self-pay

## 2023-11-22 ENCOUNTER — Telehealth (INDEPENDENT_AMBULATORY_CARE_PROVIDER_SITE_OTHER): Payer: No Typology Code available for payment source | Admitting: Psychiatry

## 2023-11-22 ENCOUNTER — Telehealth (HOSPITAL_COMMUNITY): Payer: BC Managed Care – PPO | Admitting: Psychiatry

## 2023-11-22 ENCOUNTER — Encounter (HOSPITAL_COMMUNITY): Payer: Self-pay | Admitting: Psychiatry

## 2023-11-22 ENCOUNTER — Other Ambulatory Visit (HOSPITAL_COMMUNITY): Payer: Self-pay

## 2023-11-22 DIAGNOSIS — F3341 Major depressive disorder, recurrent, in partial remission: Secondary | ICD-10-CM | POA: Diagnosis not present

## 2023-11-22 DIAGNOSIS — F411 Generalized anxiety disorder: Secondary | ICD-10-CM | POA: Diagnosis not present

## 2023-11-22 DIAGNOSIS — F603 Borderline personality disorder: Secondary | ICD-10-CM | POA: Diagnosis not present

## 2023-11-22 DIAGNOSIS — R4588 Nonsuicidal self-harm: Secondary | ICD-10-CM

## 2023-11-22 DIAGNOSIS — F4010 Social phobia, unspecified: Secondary | ICD-10-CM

## 2023-11-22 DIAGNOSIS — F4310 Post-traumatic stress disorder, unspecified: Secondary | ICD-10-CM

## 2023-11-22 DIAGNOSIS — R439 Unspecified disturbances of smell and taste: Secondary | ICD-10-CM

## 2023-11-22 DIAGNOSIS — R45851 Suicidal ideations: Secondary | ICD-10-CM

## 2023-11-22 MED ORDER — FLUOXETINE HCL 40 MG PO CAPS
40.0000 mg | ORAL_CAPSULE | Freq: Every day | ORAL | 2 refills | Status: DC
  Filled 2023-11-22: qty 30, 30d supply, fill #0
  Filled 2024-01-02: qty 30, 30d supply, fill #1
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
## 2023-11-22 NOTE — Patient Instructions (Signed)
 We did not make any medication changes.  Keep up the good work with using the DBT manual and looking for a therapist.  I will coordinate with your PCP about getting a neurology referral to make sure that that cigarette smell is not coming from seizure activity.  Please send her a message as well and hopefully we can get that blood work that we have been talking about as well as a nutrition referral.  Do try to get up slowly as you likely are having some low blood pressure which accounts for the dizziness and lack of vision.

## 2023-11-25 ENCOUNTER — Other Ambulatory Visit (HOSPITAL_COMMUNITY): Payer: Self-pay

## 2023-11-25 ENCOUNTER — Other Ambulatory Visit: Payer: Self-pay

## 2024-01-02 ENCOUNTER — Other Ambulatory Visit: Payer: Self-pay | Admitting: Family Medicine

## 2024-01-02 ENCOUNTER — Other Ambulatory Visit (HOSPITAL_COMMUNITY): Payer: Self-pay

## 2024-01-02 ENCOUNTER — Encounter: Payer: Self-pay | Admitting: Family Medicine

## 2024-01-02 DIAGNOSIS — R630 Anorexia: Secondary | ICD-10-CM

## 2024-01-02 DIAGNOSIS — R439 Unspecified disturbances of smell and taste: Secondary | ICD-10-CM

## 2024-01-02 NOTE — Progress Notes (Signed)
 The patient's psychiatrist reached out requesting a referral to neurology, stating that the patient has been experiencing a sensation of smelling cigarettes, along with muscle twitching and dissociation over the past year. These symptoms raise concern for possible seizure activity, and the psychiatrist recommends further neurological evaluation.  A referral to neurology has been placed for further assessment and diagnostic testing, such as an EEG (electroencephalogram) or brain imaging, to determine if seizures or another neurological condition may be contributing to these symptoms

## 2024-01-17 ENCOUNTER — Other Ambulatory Visit (HOSPITAL_COMMUNITY): Payer: Self-pay

## 2024-01-17 ENCOUNTER — Other Ambulatory Visit: Payer: Self-pay

## 2024-01-17 ENCOUNTER — Telehealth (HOSPITAL_COMMUNITY): Payer: BC Managed Care – PPO | Admitting: Psychiatry

## 2024-01-17 ENCOUNTER — Encounter (HOSPITAL_COMMUNITY): Payer: Self-pay | Admitting: Psychiatry

## 2024-01-17 DIAGNOSIS — F411 Generalized anxiety disorder: Secondary | ICD-10-CM | POA: Diagnosis not present

## 2024-01-17 DIAGNOSIS — F3341 Major depressive disorder, recurrent, in partial remission: Secondary | ICD-10-CM | POA: Diagnosis not present

## 2024-01-17 DIAGNOSIS — F603 Borderline personality disorder: Secondary | ICD-10-CM | POA: Diagnosis not present

## 2024-01-17 DIAGNOSIS — F5104 Psychophysiologic insomnia: Secondary | ICD-10-CM | POA: Insufficient documentation

## 2024-01-17 DIAGNOSIS — F4310 Post-traumatic stress disorder, unspecified: Secondary | ICD-10-CM

## 2024-01-17 DIAGNOSIS — F4010 Social phobia, unspecified: Secondary | ICD-10-CM

## 2024-01-17 MED ORDER — FLUOXETINE HCL 20 MG PO CAPS
40.0000 mg | ORAL_CAPSULE | Freq: Every day | ORAL | 2 refills | Status: DC
  Filled 2024-01-17: qty 60, 30d supply, fill #0

## 2024-01-17 MED ORDER — MIRTAZAPINE 15 MG PO TBDP
15.0000 mg | ORAL_TABLET | Freq: Every day | ORAL | 2 refills | Status: DC
  Filled 2024-01-17: qty 30, 30d supply, fill #0
  Filled 2024-02-20: qty 30, 30d supply, fill #1
  Filled 2024-03-20: qty 30, 30d supply, fill #2
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
## 2024-01-17 NOTE — Patient Instructions (Signed)
 We changed the fluoxetine (Prozac) to 20 mg capsules to have a more favorable pill size.  So take 2 of them once daily to get the 40 mg dose.  We also added mirtazapine (Remeron) dissolving tabs which you can place in your mouth and they will dissolve rather than having to try to swallow them.  Take this about 30 to 45 minutes before you want to be asleep and give yourself 8 hours to be asleep once you take it.

## 2024-01-18 ENCOUNTER — Other Ambulatory Visit (HOSPITAL_COMMUNITY): Payer: Self-pay

## 2024-02-20 ENCOUNTER — Other Ambulatory Visit (HOSPITAL_COMMUNITY): Payer: Self-pay

## 2024-02-23 ENCOUNTER — Encounter: Payer: Self-pay | Admitting: Family Medicine

## 2024-03-20 ENCOUNTER — Encounter (HOSPITAL_COMMUNITY): Payer: Self-pay | Admitting: Psychiatry

## 2024-03-20 ENCOUNTER — Other Ambulatory Visit (HOSPITAL_COMMUNITY): Payer: Self-pay

## 2024-03-20 ENCOUNTER — Telehealth (INDEPENDENT_AMBULATORY_CARE_PROVIDER_SITE_OTHER): Admitting: Psychiatry

## 2024-03-20 ENCOUNTER — Other Ambulatory Visit: Payer: Self-pay

## 2024-03-20 DIAGNOSIS — F4310 Post-traumatic stress disorder, unspecified: Secondary | ICD-10-CM | POA: Diagnosis not present

## 2024-03-20 DIAGNOSIS — F3341 Major depressive disorder, recurrent, in partial remission: Secondary | ICD-10-CM | POA: Diagnosis not present

## 2024-03-20 DIAGNOSIS — F5104 Psychophysiologic insomnia: Secondary | ICD-10-CM

## 2024-03-20 DIAGNOSIS — F411 Generalized anxiety disorder: Secondary | ICD-10-CM

## 2024-03-20 DIAGNOSIS — F603 Borderline personality disorder: Secondary | ICD-10-CM | POA: Diagnosis not present

## 2024-03-20 DIAGNOSIS — F4010 Social phobia, unspecified: Secondary | ICD-10-CM

## 2024-03-20 MED ORDER — TRAZODONE HCL 50 MG PO TABS
50.0000 mg | ORAL_TABLET | Freq: Every day | ORAL | 5 refills | Status: DC
  Filled 2024-03-20: qty 30, 30d supply, fill #0

## 2024-03-20 MED ORDER — FLUOXETINE HCL 20 MG PO CAPS
40.0000 mg | ORAL_CAPSULE | Freq: Every day | ORAL | 5 refills | Status: DC
  Filled 2024-03-20: qty 60, 30d supply, fill #0
  Filled 2024-04-07 – 2024-04-12 (×2): qty 60, 30d supply, fill #1

## 2024-03-20 NOTE — Patient Instructions (Signed)
 We discontinued remeron  in favor of starting trazodone 50mg  nightly today. Continue to practice those mindfulness techniques we went over and that should help the dissociation and muscle twitching. It would still be good to see neurology to rule out seizure activity. Similarly, the orders for your blood work and nutrition referral are in so please follow up with your PCP. To find your next psychiatrist, you could try [facility] in [place] or [facility] in [place]. To stay with  you could try their [place] office or [facility] on [address]
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
## 2024-03-29 ENCOUNTER — Encounter (HOSPITAL_COMMUNITY): Payer: Self-pay

## 2024-03-30 ENCOUNTER — Other Ambulatory Visit: Payer: Self-pay

## 2024-03-30 ENCOUNTER — Other Ambulatory Visit (HOSPITAL_COMMUNITY): Payer: Self-pay

## 2024-03-30 ENCOUNTER — Other Ambulatory Visit (HOSPITAL_COMMUNITY): Payer: Self-pay | Admitting: Psychiatry

## 2024-03-30 DIAGNOSIS — F411 Generalized anxiety disorder: Secondary | ICD-10-CM

## 2024-03-30 DIAGNOSIS — F3341 Major depressive disorder, recurrent, in partial remission: Secondary | ICD-10-CM

## 2024-03-30 DIAGNOSIS — F5104 Psychophysiologic insomnia: Secondary | ICD-10-CM

## 2024-03-30 MED ORDER — MIRTAZAPINE 15 MG PO TBDP
7.5000 mg | ORAL_TABLET | Freq: Every day | ORAL | 5 refills | Status: AC
  Filled 2024-03-30: qty 30, 60d supply, fill #0
  Filled 2024-05-02 – 2024-05-15 (×3): qty 30, 60d supply, fill #1
  Filled 2024-06-20 – 2024-07-03 (×2): qty 30, 60d supply, fill #2
  Filled 2024-08-23: qty 30, 60d supply, fill #3
  Filled 2024-09-29 – 2024-10-25 (×3): qty 30, 60d supply, fill #4
  Filled ????-??-??: fill #4

## 2024-03-30 NOTE — Progress Notes (Signed)
 Patient experiencing dizziness on trazodone  and prefers to go back on Remeron .  We will resume Remeron  at a lower dose as it may have been causing her restless leg syndrome.

## 2024-04-07 ENCOUNTER — Other Ambulatory Visit (HOSPITAL_COMMUNITY): Payer: Self-pay

## 2024-05-03 ENCOUNTER — Other Ambulatory Visit: Payer: Self-pay

## 2024-05-03 ENCOUNTER — Other Ambulatory Visit (HOSPITAL_COMMUNITY): Payer: Self-pay

## 2024-05-07 ENCOUNTER — Ambulatory Visit: Admitting: Family Medicine

## 2024-05-11 ENCOUNTER — Other Ambulatory Visit (HOSPITAL_COMMUNITY): Payer: Self-pay

## 2024-05-15 ENCOUNTER — Other Ambulatory Visit (HOSPITAL_COMMUNITY): Payer: Self-pay

## 2024-05-16 ENCOUNTER — Encounter: Payer: Self-pay | Admitting: Pharmacist

## 2024-05-16 ENCOUNTER — Other Ambulatory Visit: Payer: Self-pay

## 2024-05-16 ENCOUNTER — Other Ambulatory Visit (HOSPITAL_COMMUNITY): Payer: Self-pay

## 2024-05-17 ENCOUNTER — Other Ambulatory Visit: Payer: Self-pay

## 2024-05-17 ENCOUNTER — Ambulatory Visit

## 2024-06-20 ENCOUNTER — Other Ambulatory Visit (HOSPITAL_COMMUNITY): Payer: Self-pay

## 2024-06-20 ENCOUNTER — Other Ambulatory Visit (HOSPITAL_BASED_OUTPATIENT_CLINIC_OR_DEPARTMENT_OTHER): Payer: Self-pay

## 2024-07-03 ENCOUNTER — Other Ambulatory Visit (HOSPITAL_COMMUNITY): Payer: Self-pay

## 2024-08-23 ENCOUNTER — Other Ambulatory Visit (HOSPITAL_COMMUNITY): Payer: Self-pay

## 2024-09-30 ENCOUNTER — Other Ambulatory Visit (HOSPITAL_COMMUNITY): Payer: Self-pay

## 2024-10-22 ENCOUNTER — Ambulatory Visit (INDEPENDENT_AMBULATORY_CARE_PROVIDER_SITE_OTHER): Admitting: Registered Nurse

## 2024-10-22 ENCOUNTER — Other Ambulatory Visit (HOSPITAL_COMMUNITY): Payer: Self-pay

## 2024-10-22 ENCOUNTER — Encounter (HOSPITAL_COMMUNITY): Payer: Self-pay | Admitting: Registered Nurse

## 2024-10-22 ENCOUNTER — Other Ambulatory Visit: Payer: Self-pay

## 2024-10-22 DIAGNOSIS — Z79899 Other long term (current) drug therapy: Secondary | ICD-10-CM

## 2024-10-22 DIAGNOSIS — F411 Generalized anxiety disorder: Secondary | ICD-10-CM

## 2024-10-22 DIAGNOSIS — G4700 Insomnia, unspecified: Secondary | ICD-10-CM

## 2024-10-22 DIAGNOSIS — F4310 Post-traumatic stress disorder, unspecified: Secondary | ICD-10-CM

## 2024-10-22 DIAGNOSIS — F333 Major depressive disorder, recurrent, severe with psychotic symptoms: Secondary | ICD-10-CM

## 2024-10-22 MED ORDER — VENLAFAXINE HCL ER 37.5 MG PO CP24
37.5000 mg | ORAL_CAPSULE | Freq: Every day | ORAL | 1 refills | Status: DC
  Filled 2024-10-22: qty 30, 30d supply, fill #0

## 2024-10-22 MED ORDER — QUETIAPINE FUMARATE 25 MG PO TABS
25.0000 mg | ORAL_TABLET | Freq: Every evening | ORAL | 1 refills | Status: AC | PRN
  Filled 2024-10-22: qty 30, 30d supply, fill #0

## 2024-10-22 MED ORDER — HYDROXYZINE PAMOATE 25 MG PO CAPS
25.0000 mg | ORAL_CAPSULE | Freq: Three times a day (TID) | ORAL | 1 refills | Status: DC | PRN
  Filled 2024-10-22: qty 90, 30d supply, fill #0
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
## 2024-10-22 NOTE — Patient Instructions (Addendum)
 If no one has contacted, you by the end of business day today please call the appropriate office listed below to schedule your next visit for medication management with Luisa Ruder, NP:    [facility] at [facility] [address], [address], [place], [place] [address]  5.4 mi Phone: [phone] (Call to schedule appointment)  [facility] at [facility] [address] [place], [place] [address]  25 mi Phone: [phone] (Call to schedule appointment)  Routine Lab Work: Labs are ordered since it's been a while since your last tests. Labs should be checked at least yearly, or more frequently depending on prescribed medications. Routine blood work helps assess overall health and rule out non-psychiatric conditions. Important tests include: Complete Blood Count (CBC) with Differential/Platelet Comprehensive Metabolic Panel Hemoglobin A1c (diabetes screening) Magnesium Ethanol Urinalysis (Routine with reflex microscopic, Clean Catch) Pregnancy test (urine, for females of childbearing age) POCT Urine Drug Screen (if prescribed any controlled substance) EKG Recommendations: An EKG is recommended before starting psychotropic medications and periodically thereafter to monitor for prolonged QTc, which can indicate cardiac risk factors. Some psychotropic medications can increase the risk of prolonged QTc. Have your primary care provider perform an EKG at your next visit and send the results if not available on Epic. Please ensure you have your labs drawn before your next scheduled visit.  Call 911, [phone], mobile crisis, or present to the nearest emergency room should you experience any suicidal/homicidal ideation, auditory/visual/hallucinations, or detrimental worsening of your mental health.  Mobile Crisis Response Teams Listed by counties in vicinity of [facility] providers [facility]  [phone] [facility] [phone] [facility] [phone] [facility] [phone] [place]                * [facility] [phone]                * [employer] [phone]  [facility] [phone] [facility] [employer]  [phone] * [employer] [phone]

## 2024-10-25 ENCOUNTER — Other Ambulatory Visit: Payer: Self-pay

## 2024-10-25 ENCOUNTER — Other Ambulatory Visit (HOSPITAL_COMMUNITY): Payer: Self-pay

## 2024-10-25 MED ORDER — ONDANSETRON 4 MG PO TBDP
4.0000 mg | ORAL_TABLET | Freq: Three times a day (TID) | ORAL | 3 refills | Status: AC | PRN
  Filled 2024-10-25 (×2): qty 28, 10d supply, fill #0

## 2024-10-30 ENCOUNTER — Ambulatory Visit (INDEPENDENT_AMBULATORY_CARE_PROVIDER_SITE_OTHER)

## 2024-10-30 DIAGNOSIS — F4310 Post-traumatic stress disorder, unspecified: Secondary | ICD-10-CM | POA: Diagnosis not present

## 2024-10-30 DIAGNOSIS — F603 Borderline personality disorder: Secondary | ICD-10-CM
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
## 2024-11-13 ENCOUNTER — Ambulatory Visit (INDEPENDENT_AMBULATORY_CARE_PROVIDER_SITE_OTHER)

## 2024-11-13 ENCOUNTER — Encounter (HOSPITAL_COMMUNITY): Payer: Self-pay

## 2024-11-13 DIAGNOSIS — F603 Borderline personality disorder: Secondary | ICD-10-CM

## 2024-11-13 NOTE — Progress Notes (Signed)
" ° °  THERAPIST PROGRESS NOTE  Session Time: 2:01 - 2:58 PM  Participation Level: Active  Behavioral Response: CasualAlertAnxious  Type of Therapy: Individual Therapy  Treatment Goals addressed: Developing rapport, discussing BPD diagnosis and DBT skills and how they apply, working on a list of current coping skills.  ProgressTowards Goals: Initial  Interventions: DBT, Motivational Interviewing, Strength-based, and Supportive  Summary: DILLAN LUNDEN is a 23 y.o. adult who presents with episodes of anger, agoraphobia (does not like to leave the house experiences mild panic symptoms when she has to go out in public even with a support person present. Experiences episodes where her emotions are so intense she cuts to relieve her feelings. No current active SI. Does have passive SI with no plan/intent at times. Reports feelings of depresonalization and fleeting episodes where she sees shadows out of the corner of her eye when very stressed. Today we talked about her experience with DBT therapy in the past, what is working and not working.  She has reasonable emotion regulation skills, but does not acknowledge and express her feelings when appropriate.  She is distressed by her internal experience of intense emotions and the feeling that she is unable to experience normal happiness.  Nevertheless she has made progress towards her GED online and is working towards getting results of formal psychological testing..   Suicidal/Homicidal: Nowithout intent/plan  Therapist Response: Therapist worked from a research scientist (physical sciences) based perspective to ensure Arrie was comfortable in session and engage with treatment.  We developed a list of current coping skills.  Although Arrie has a number of emotion regulation strategies she wants to continue to work on these skills in hopes of moderating her internal experience of emotions.  We discussed DBT wise mind, blending together analysis and emotion to develop  the best approach to solving problems.  Ollie agreed to spend 10 minutes daily practicing wise mind by experiencing the emotions of a moderately triggering event and then analyzing it logically, then attempting to bring the emotion mind and rational mind together to come up with solutions.  Therapist requested she write this process down and bring to next session.  Plan: Return again in 2 weeks.  Diagnosis: Borderline personality disorder (HCC)  Collaboration of Care: Not needed at this visit  Patient/Guardian was advised Release of Information must be obtained prior to any record release in order to collaborate their care with an outside provider. Patient/Guardian was advised if they have not already done so to contact the registration department to sign all necessary forms in order for us  to release information regarding their care.   Consent: Patient/Guardian gives verbal consent for treatment and assignment of benefits for services provided during this visit. Patient/Guardian expressed understanding and agreed to proceed.   Lauraine Ferrari, LCSW 11/13/2024  "

## 2024-11-19 ENCOUNTER — Telehealth (INDEPENDENT_AMBULATORY_CARE_PROVIDER_SITE_OTHER): Admitting: Registered Nurse

## 2024-11-19 ENCOUNTER — Other Ambulatory Visit (HOSPITAL_COMMUNITY): Payer: Self-pay

## 2024-11-19 ENCOUNTER — Encounter (HOSPITAL_COMMUNITY): Payer: Self-pay | Admitting: Registered Nurse

## 2024-11-19 DIAGNOSIS — G4700 Insomnia, unspecified: Secondary | ICD-10-CM

## 2024-11-19 DIAGNOSIS — F333 Major depressive disorder, recurrent, severe with psychotic symptoms: Secondary | ICD-10-CM

## 2024-11-19 DIAGNOSIS — F4310 Post-traumatic stress disorder, unspecified: Secondary | ICD-10-CM | POA: Diagnosis not present

## 2024-11-19 DIAGNOSIS — F411 Generalized anxiety disorder: Secondary | ICD-10-CM

## 2024-11-19 MED ORDER — VENLAFAXINE HCL ER 37.5 MG PO CP24
37.5000 mg | ORAL_CAPSULE | Freq: Every day | ORAL | 1 refills | Status: AC
  Filled 2024-11-19: qty 30, 30d supply, fill #0

## 2024-11-19 MED ORDER — HYDROXYZINE PAMOATE 25 MG PO CAPS
25.0000 mg | ORAL_CAPSULE | Freq: Three times a day (TID) | ORAL | 1 refills | Status: AC | PRN
  Filled 2024-11-19: qty 90, 30d supply, fill #0

## 2024-11-19 MED ORDER — MIRTAZAPINE 7.5 MG PO TABS
7.5000 mg | ORAL_TABLET | Freq: Every day | ORAL | 1 refills | Status: AC
  Filled 2024-11-19 – 2024-12-05 (×2): qty 30, 30d supply, fill #0
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
## 2024-11-19 NOTE — Patient Instructions (Signed)

## 2024-11-20 ENCOUNTER — Other Ambulatory Visit: Payer: Self-pay

## 2024-11-21 ENCOUNTER — Other Ambulatory Visit: Payer: Self-pay

## 2024-12-01 LAB — CBC WITH DIFFERENTIAL/PLATELET
Absolute Lymphocytes: 1315 {cells}/uL (ref 850–3900)
Absolute Monocytes: 254 {cells}/uL (ref 200–950)
Basophils Absolute: 10 {cells}/uL (ref 0–200)
Basophils Relative: 0.2 %
Eosinophils Absolute: 29 {cells}/uL (ref 15–500)
Eosinophils Relative: 0.6 %
HCT: 42.7 % (ref 35.9–46.0)
Hemoglobin: 14.7 g/dL (ref 11.7–15.5)
MCH: 31.2 pg (ref 27.0–33.0)
MCHC: 34.4 g/dL (ref 31.6–35.4)
MCV: 90.7 fL (ref 81.4–101.7)
MPV: 9.2 fL (ref 7.5–12.5)
Monocytes Relative: 5.3 %
Neutro Abs: 3192 {cells}/uL (ref 1500–7800)
Neutrophils Relative %: 66.5 %
Platelets: 327 Thousand/uL (ref 140–400)
RBC: 4.71 Million/uL (ref 3.80–5.10)
RDW: 11.7 % (ref 11.0–15.0)
Total Lymphocyte: 27.4 %
WBC: 4.8 Thousand/uL (ref 3.8–10.8)

## 2024-12-01 LAB — COMPLETE METABOLIC PANEL WITHOUT GFR
AG Ratio: 2.1 (calc) (ref 1.0–2.5)
ALT: 8 U/L (ref 6–29)
AST: 10 U/L (ref 10–30)
Albumin: 4.5 g/dL (ref 3.6–5.1)
Alkaline phosphatase (APISO): 61 U/L (ref 31–125)
BUN/Creatinine Ratio: 3 (calc) — ABNORMAL LOW (ref 6–22)
BUN: 2 mg/dL — ABNORMAL LOW (ref 7–25)
CO2: 27 mmol/L (ref 20–32)
Calcium: 9.1 mg/dL (ref 8.6–10.2)
Chloride: 106 mmol/L (ref 98–110)
Creat: 0.62 mg/dL (ref 0.50–0.96)
Globulin: 2.1 g/dL (ref 1.9–3.7)
Glucose, Bld: 93 mg/dL (ref 65–139)
Potassium: 4.2 mmol/L (ref 3.5–5.3)
Sodium: 140 mmol/L (ref 135–146)
Total Bilirubin: 0.5 mg/dL (ref 0.2–1.2)
Total Protein: 6.6 g/dL (ref 6.1–8.1)

## 2024-12-01 LAB — TSH: TSH: 1.49 m[IU]/L

## 2024-12-01 LAB — MAGNESIUM: Magnesium: 1.9 mg/dL (ref 1.5–2.5)

## 2024-12-01 LAB — HEMOGLOBIN A1C
Hgb A1c MFr Bld: 4.4 %
Mean Plasma Glucose: 80 mg/dL
eAG (mmol/L): 4.4 mmol/L

## 2024-12-01 LAB — LIPID PANEL
Cholesterol: 173 mg/dL
HDL: 42 mg/dL — ABNORMAL LOW
LDL Cholesterol (Calc): 107 mg/dL — ABNORMAL HIGH
Non-HDL Cholesterol (Calc): 131 mg/dL — ABNORMAL HIGH
Total CHOL/HDL Ratio: 4.1 (calc)
Triglycerides: 128 mg/dL

## 2024-12-01 LAB — PROLACTIN: Prolactin: 14.9 ng/mL

## 2024-12-01 LAB — HCG, SERUM, QUALITATIVE: Preg, Serum: NEGATIVE

## 2024-12-06 ENCOUNTER — Other Ambulatory Visit: Payer: Self-pay
